# Patient Record
Sex: Female | Born: 1955 | Race: Black or African American | Hispanic: No | Smoking: Never smoker
Health system: Southern US, Community
[De-identification: ages and names within clinical notes are randomized; demographics above are authoritative.]

## PROBLEM LIST (undated history)

## (undated) DIAGNOSIS — J45909 Unspecified asthma, uncomplicated: Secondary | ICD-10-CM

## (undated) DIAGNOSIS — I509 Heart failure, unspecified: Secondary | ICD-10-CM

## (undated) DIAGNOSIS — E119 Type 2 diabetes mellitus without complications: Secondary | ICD-10-CM

---

## 2014-09-24 ENCOUNTER — Emergency Department (HOSPITAL_COMMUNITY): Payer: Medicare Other

## 2014-09-24 ENCOUNTER — Encounter (HOSPITAL_COMMUNITY): Payer: Self-pay | Admitting: Emergency Medicine

## 2014-09-24 ENCOUNTER — Inpatient Hospital Stay (HOSPITAL_COMMUNITY)
Admission: EM | Admit: 2014-09-24 | Discharge: 2014-09-30 | DRG: 208 | Disposition: A | Discharge status: Home Health | Payer: Medicare Other | Attending: Emergency Medicine | Admitting: Emergency Medicine

## 2014-09-24 DIAGNOSIS — N182 Chronic kidney disease, stage 2 (mild): Secondary | ICD-10-CM | POA: Diagnosis present

## 2014-09-24 DIAGNOSIS — D509 Iron deficiency anemia, unspecified: Secondary | ICD-10-CM | POA: Diagnosis present

## 2014-09-24 DIAGNOSIS — J441 Chronic obstructive pulmonary disease with (acute) exacerbation: Secondary | ICD-10-CM | POA: Diagnosis present

## 2014-09-24 DIAGNOSIS — R001 Bradycardia, unspecified: Secondary | ICD-10-CM | POA: Diagnosis present

## 2014-09-24 DIAGNOSIS — G9340 Encephalopathy, unspecified: Secondary | ICD-10-CM | POA: Diagnosis present

## 2014-09-24 DIAGNOSIS — J45901 Unspecified asthma with (acute) exacerbation: Secondary | ICD-10-CM | POA: Diagnosis present

## 2014-09-24 DIAGNOSIS — Z87891 Personal history of nicotine dependence: Secondary | ICD-10-CM

## 2014-09-24 DIAGNOSIS — Z794 Long term (current) use of insulin: Secondary | ICD-10-CM

## 2014-09-24 DIAGNOSIS — Z6833 Body mass index (BMI) 33.0-33.9, adult: Secondary | ICD-10-CM | POA: Diagnosis not present

## 2014-09-24 DIAGNOSIS — R6510 Systemic inflammatory response syndrome (SIRS) of non-infectious origin without acute organ dysfunction: Secondary | ICD-10-CM | POA: Diagnosis present

## 2014-09-24 DIAGNOSIS — E662 Morbid (severe) obesity with alveolar hypoventilation: Secondary | ICD-10-CM | POA: Diagnosis present

## 2014-09-24 DIAGNOSIS — Z9981 Dependence on supplemental oxygen: Secondary | ICD-10-CM

## 2014-09-24 DIAGNOSIS — I129 Hypertensive chronic kidney disease with stage 1 through stage 4 chronic kidney disease, or unspecified chronic kidney disease: Secondary | ICD-10-CM | POA: Diagnosis present

## 2014-09-24 DIAGNOSIS — Z23 Encounter for immunization: Secondary | ICD-10-CM | POA: Diagnosis not present

## 2014-09-24 DIAGNOSIS — R0602 Shortness of breath: Secondary | ICD-10-CM

## 2014-09-24 DIAGNOSIS — R000 Tachycardia, unspecified: Secondary | ICD-10-CM | POA: Diagnosis not present

## 2014-09-24 DIAGNOSIS — T41295A Adverse effect of other general anesthetics, initial encounter: Secondary | ICD-10-CM | POA: Diagnosis present

## 2014-09-24 DIAGNOSIS — I509 Heart failure, unspecified: Secondary | ICD-10-CM | POA: Diagnosis present

## 2014-09-24 DIAGNOSIS — E876 Hypokalemia: Secondary | ICD-10-CM | POA: Diagnosis present

## 2014-09-24 DIAGNOSIS — E872 Acidosis: Secondary | ICD-10-CM | POA: Diagnosis present

## 2014-09-24 DIAGNOSIS — E1165 Type 2 diabetes mellitus with hyperglycemia: Secondary | ICD-10-CM | POA: Diagnosis present

## 2014-09-24 DIAGNOSIS — J9622 Acute and chronic respiratory failure with hypercapnia: Secondary | ICD-10-CM | POA: Diagnosis present

## 2014-09-24 DIAGNOSIS — I952 Hypotension due to drugs: Secondary | ICD-10-CM | POA: Diagnosis present

## 2014-09-24 DIAGNOSIS — I248 Other forms of acute ischemic heart disease: Secondary | ICD-10-CM | POA: Diagnosis not present

## 2014-09-24 DIAGNOSIS — J9600 Acute respiratory failure, unspecified whether with hypoxia or hypercapnia: Secondary | ICD-10-CM

## 2014-09-24 DIAGNOSIS — J9690 Respiratory failure, unspecified, unspecified whether with hypoxia or hypercapnia: Secondary | ICD-10-CM | POA: Diagnosis present

## 2014-09-24 HISTORY — DX: Type 2 diabetes mellitus without complications: E11.9

## 2014-09-24 HISTORY — DX: Heart failure, unspecified: I50.9

## 2014-09-24 HISTORY — DX: Unspecified asthma, uncomplicated: J45.909

## 2014-09-24 LAB — CBC WITH DIFFERENTIAL/PLATELET
Basophils Absolute: 0.1 10*3/uL (ref 0.0–0.1)
Basophils Relative: 0 % (ref 0–1)
EOS PCT: 1 % (ref 0–5)
Eosinophils Absolute: 0.1 10*3/uL (ref 0.0–0.7)
HCT: 37.9 % (ref 36.0–46.0)
Hemoglobin: 11.8 g/dL — ABNORMAL LOW (ref 12.0–15.0)
LYMPHS PCT: 31 % (ref 12–46)
Lymphs Abs: 3.7 10*3/uL (ref 0.7–4.0)
MCH: 22.7 pg — ABNORMAL LOW (ref 26.0–34.0)
MCHC: 31.1 g/dL (ref 30.0–36.0)
MCV: 72.9 fL — ABNORMAL LOW (ref 78.0–100.0)
Monocytes Absolute: 0.7 10*3/uL (ref 0.1–1.0)
Monocytes Relative: 5 % (ref 3–12)
Neutro Abs: 7.7 10*3/uL (ref 1.7–7.7)
Neutrophils Relative %: 63 % (ref 43–77)
Platelets: 283 10*3/uL (ref 150–400)
RBC: 5.2 MIL/uL — ABNORMAL HIGH (ref 3.87–5.11)
RDW: 16.4 % — AB (ref 11.5–15.5)
WBC: 12.2 10*3/uL — ABNORMAL HIGH (ref 4.0–10.5)

## 2014-09-24 LAB — URINE MICROSCOPIC-ADD ON

## 2014-09-24 LAB — COMPREHENSIVE METABOLIC PANEL
ALT: 32 U/L (ref 0–35)
ANION GAP: 13 (ref 5–15)
AST: 89 U/L — AB (ref 0–37)
Albumin: 2.7 g/dL — ABNORMAL LOW (ref 3.5–5.2)
Alkaline Phosphatase: 116 U/L (ref 39–117)
BILIRUBIN TOTAL: 0.3 mg/dL (ref 0.3–1.2)
BUN: 11 mg/dL (ref 6–23)
CALCIUM: 8 mg/dL — AB (ref 8.4–10.5)
CO2: 24 mmol/L (ref 19–32)
Chloride: 100 mmol/L (ref 96–112)
Creatinine, Ser: 1.22 mg/dL — ABNORMAL HIGH (ref 0.50–1.10)
GFR calc Af Amer: 56 mL/min — ABNORMAL LOW (ref >90)
GFR calc non Af Amer: 48 mL/min — ABNORMAL LOW (ref >90)
Glucose, Bld: 491 mg/dL — ABNORMAL HIGH (ref 70–99)
Potassium: 3.9 mmol/L (ref 3.5–5.1)
SODIUM: 137 mmol/L (ref 135–145)
TOTAL PROTEIN: 5.9 g/dL — AB (ref 6.0–8.3)

## 2014-09-24 LAB — URINALYSIS, ROUTINE W REFLEX MICROSCOPIC
Bilirubin Urine: NEGATIVE
Glucose, UA: >1000 mg/dL — AB
Ketones, ur: NEGATIVE mg/dL
Leukocytes, UA: NEGATIVE
NITRITE: NEGATIVE
Protein, ur: >300 mg/dL — AB
Specific Gravity, Urine: 1.018 (ref 1.005–1.030)
Urobilinogen, UA: 0.2 mg/dL (ref 0.0–1.0)
pH: 7 (ref 5.0–8.0)

## 2014-09-24 LAB — I-STAT TROPONIN, ED: Troponin i, poc: 0.01 ng/mL (ref 0.00–0.08)

## 2014-09-24 LAB — I-STAT CHEM 8, ED
BUN: 13 mg/dL (ref 6–23)
CHLORIDE: 99 mmol/L (ref 96–112)
Calcium, Ion: 1.12 mmol/L (ref 1.12–1.23)
Creatinine, Ser: 1 mg/dL (ref 0.50–1.10)
Glucose, Bld: 506 mg/dL — ABNORMAL HIGH (ref 70–99)
HEMATOCRIT: 45 % (ref 36.0–46.0)
Hemoglobin: 15.3 g/dL — ABNORMAL HIGH (ref 12.0–15.0)
Potassium: 3.9 mmol/L (ref 3.5–5.1)
SODIUM: 139 mmol/L (ref 135–145)
TCO2: 27 mmol/L (ref 0–100)

## 2014-09-24 LAB — BLOOD GAS, ARTERIAL
Acid-base deficit: 3 mmol/L — ABNORMAL HIGH (ref 0.0–2.0)
Bicarbonate: 26.5 mEq/L — ABNORMAL HIGH (ref 20.0–24.0)
Drawn by: 24487
FIO2: 1 %
MECHVT: 480 mL
O2 SAT: 99 %
PATIENT TEMPERATURE: 98.6
PEEP: 5 cmH2O
RATE: 15 resp/min
TCO2: 29.6 mmol/L (ref 0–100)
pCO2 arterial: 99.7 mmHg (ref 35.0–45.0)
pH, Arterial: 7.054 — CL (ref 7.350–7.450)
pO2, Arterial: 335 mmHg — ABNORMAL HIGH (ref 80.0–100.0)

## 2014-09-24 LAB — BRAIN NATRIURETIC PEPTIDE: B Natriuretic Peptide: 209.6 pg/mL — ABNORMAL HIGH (ref 0.0–100.0)

## 2014-09-24 LAB — CBG MONITORING, ED: Glucose-Capillary: 459 mg/dL — ABNORMAL HIGH (ref 70–99)

## 2014-09-24 LAB — TROPONIN I: Troponin I: 0.03 ng/mL (ref <0.031)

## 2014-09-24 MED ORDER — PROPOFOL 10 MG/ML IV EMUL
5.0000 ug/kg/min | Freq: Once | INTRAVENOUS | Status: AC
  Administered 2014-09-24: 10 ug/kg/min via INTRAVENOUS

## 2014-09-24 MED ORDER — SODIUM BICARBONATE 8.4 % IV SOLN
50.0000 meq | Freq: Once | INTRAVENOUS | Status: AC
  Administered 2014-09-24: 50 meq via INTRAVENOUS
  Filled 2014-09-24: qty 50

## 2014-09-24 MED ORDER — ETOMIDATE 2 MG/ML IV SOLN
20.0000 mg/kg | Freq: Once | INTRAVENOUS | Status: AC
  Administered 2014-09-24: 20 mg via INTRAVENOUS

## 2014-09-24 MED ORDER — PROPOFOL 10 MG/ML IV EMUL
INTRAVENOUS | Status: AC
  Administered 2014-09-24: 10 ug/kg/min via INTRAVENOUS
  Filled 2014-09-24: qty 100

## 2014-09-24 MED ORDER — ONDANSETRON HCL 4 MG/2ML IJ SOLN
4.0000 mg | Freq: Once | INTRAMUSCULAR | Status: AC
  Administered 2014-09-24: 4 mg via INTRAVENOUS

## 2014-09-24 MED ORDER — SODIUM CHLORIDE 0.9 % IV BOLUS (SEPSIS)
1000.0000 mL | Freq: Once | INTRAVENOUS | Status: AC
  Administered 2014-09-24: 1000 mL via INTRAVENOUS

## 2014-09-24 MED ORDER — ONDANSETRON HCL 4 MG/2ML IJ SOLN
INTRAMUSCULAR | Status: AC
  Filled 2014-09-24: qty 2

## 2014-09-24 NOTE — ED Notes (Signed)
Patient biting tube and opening eyes. BP 156/90 Sedation increased.

## 2014-09-24 NOTE — ED Notes (Signed)
ICU MD at bedside

## 2014-09-24 NOTE — H&P (Addendum)
PULMONARY / CRITICAL CARE MEDICINE   Name: Molly Crawford MRN: [id number] DOB: Dec 05, 1955    ADMISSION DATE:  09/24/2014 CONSULTATION DATE:  09/24/14   REFERRING MD :  EDP  CHIEF COMPLAINT:  Respiratory failure   INITIAL PRESENTATION:  59 y.o PMH DM 2, asthma, unspecified CHF who today EMS was called for SOB occuring x 20 minutes.  Initially patient noted patient to be alert but drooling.  She was given Albuterol with no improvement in aeration.  EMS arrived patient was unresponsive to stimuli.  She was intubated in the ED for respiratory failure and airway protection and CCM consulted for admission.      STUDIES:  CXR 1/29>>No acute cardiopulmonary disease. Support apparatus well positioned CT head 1/29>>  SIGNIFICANT EVENTS: 1/29 admitted>>   HISTORY OF PRESENT ILLNESS:   59 y.o PMH DM 2, asthma, unspecified CHF who today EMS was called for SOB occuring x 20 minutes.  Initially patient noted patient to be alert but drooling.  She was given Albuterol with no improvement in aeration.  EMS arrived patient was unresponsive to stimuli.  She was intubated in the ED for respiratory failure and airway protection and CCM consulted for admission.     PAST MEDICAL HISTORY :   has a past medical history of Asthma; CHF (congestive heart failure); and Diabetes mellitus without complication.  has no past surgical history on file. Prior to Admission medications   Not on File   Not on File  FAMILY HISTORY:  has no family status information on file.  SOCIAL HISTORY:    REVIEW OF SYSTEMS:   Unable to obtain due to mental status   SUBJECTIVE:  Unable to obtain due to mental status  VITAL SIGNS: Pulse Rate:  [101-138] 104 (01/29 2255) Resp:  [21-35] 35 (01/29 2255) BP: (72-176)/(46-124) 108/77 mmHg (01/29 2255) SpO2:  [99 %-100 %] 100 % (01/29 2255) FiO2 (%):  [50 %-100 %] 50 % (01/29 2252) Weight:  [220 lb (99.791 kg)] 220 lb (99.791 kg) (01/29 2214) HEMODYNAMICS:   VENTILATOR  SETTINGS: Vent Mode:  [-] PRVC FiO2 (%):  [50 %-100 %] 50 % Set Rate:  [15 bmp-35 bmp] 35 bmp Vt Set:  [480 mL] 480 mL PEEP:  [5 cmH20] 5 cmH20 Plateau Pressure:  [30 cmH20] 30 cmH20 INTAKE / OUTPUT:  Intake/Output Summary (Last 24 hours) at 09/24/14 2305 Last data filed at 09/24/14 2247  Gross per 24 hour  Intake      0 ml  Output    150 ml  Net   -150 ml    PHYSICAL EXAMINATION: General:  Lying in bed, nad, sedated  Neuro:  Sedated, not following commands, no response to stimuli HEENT:  perrl b/l, [place]/at, 7.5 ETT Cardiovascular:  ST, no murmurs  Lungs:  ctab Abdomen:  Obese, ntnd, nl bs Musculoskeletal:  intact Skin:  Intact   LABS:  CBC  Recent Labs Lab 09/24/14 2223 09/24/14 2234  WBC 12.2*  --   HGB 11.8* 15.3*  HCT 37.9 45.0  PLT 283  --    Coag's No results for input(s): APTT, INR in the last 168 hours. BMET  Recent Labs Lab 09/24/14 2234  NA 139  K 3.9  CL 99  BUN 13  CREATININE 1.00  GLUCOSE 506*   Electrolytes No results for input(s): CALCIUM, MG, PHOS in the last 168 hours. Sepsis Markers No results for input(s): LATICACIDVEN, PROCALCITON, O2SATVEN in the last 168 hours. ABG  Recent Labs Lab 09/24/14 2230  PHART 7.054*  PCO2ART 99.7*  PO2ART 335.0*   Liver Enzymes No results for input(s): AST, ALT, ALKPHOS, BILITOT, ALBUMIN in the last 168 hours. Cardiac Enzymes No results for input(s): TROPONINI, PROBNP in the last 168 hours. Glucose  Recent Labs Lab 09/24/14 2251  GLUCAP 459*    Imaging No results found.   ASSESSMENT / PLAN: PULMONARY OETT 1/29  A: Acute hypercarbic respiratory failure -Respiratory acidosis ? Etiology. PE in DDx. No obvious pneumonia. Suspect COPD exacerbation.  H/o Asthma  P:   Intubated.  Full vent support for now +/- SBT when ready  Repeat ABG now and CXR, ABG in am  Albuterol prn, Duoneb q4  Will check d dimer to r/o PE   CARDIOVASCULAR CVL none A:  Sinus bradycardia now ST Hypotension  with propofol @15   H/o unspecified CHF  P:  Monitor VS  Repeat EKG  Bolus prn IVF. Low threshold to start pressors for MAP >65  Stat echo, pending BNP, trop x 3, Aspirin  Get further records in AM [place]  RENAL A:   Acute Respiratory acidosis  Possible AKI or CKD P:   Pending lytes to follow  Will trend and replace prn  Strict i/o, daily weights  Will give 1 amp bicarb and likely start bicarb gtt   GASTROINTESTINAL A:   NPO GI px  Transaminitis  Mild to mod Malnutrition  P:   Protonix NPO Consider TF in 1-2 days   HEMATOLOGIC A:   Microcytic anemia hbg 11.8  DVT px  P:  Lovenox, scds  Trend CBC  INFECTIOUS A:  No signs of infection 3/4 currently SIRS ? sepsis (+RR, HR, leukocysotis) P:   BCx2  1/29>> UC 1/29>> Sputum 1/29>> Flu 1/29>> RVP 1/29>> Abx: none   Trend CBC, lactic acid, PCT, will check for flu and RVP, pancultures   ENDOCRINE A:   Hyperglycemic 506  H/o DM 2  P:   Will check HA1C, serum ketones, UA to r/o DKA Insulin gtt for now then wean when blood glucose stabilize  NEUROLOGIC A:   Acute encephalopathy ? Etiology   P:   RASS goal: -2 to -3 for now   Intermittent sedation with Fentanyl prn  Will check EEG r/o seizures, CT head  Will check UDS, ethanol    FAMILY  - Updates: will update when available   - Inter-disciplinary family meet or Palliative Care meeting due by:  10/01/14      Pulmonary and Critical Care Medicine [facility] Pager: [phone]  09/24/2014, 11:05 PM Desma Maximracy McLean MD [phone]   STAFF NOTE: I have personally seen and evaluated the patient and reviewed the available data. I have discussed the patient with the housestaff officer or NP. I agree with the resident's note as documented above.  Briefly: [age] F with vague history of "asthma" (although smoker), "HF", and DM presenting with acute hypercarbic respiratory likely 2/2 COPD/Asthma exacerbation. Evaluating for PE with D-dimer. Some autopeep  when I first saw her so will decrease RR, esp given MARKED improvement in gas. Will hold bicarb drip as acidosis clearly respiratory. Suspect encephalopathy all 2/2 CO2 as well.   The patient is critically ill with multiple organ systems failure and requires high complexity decision making for assessment and support, frequent evaluation and titration of therapies, application of advanced monitoring technologies and extensive interpretation of multiple databases. Critical Care Time devoted to patient care services described in this note is 40 minutes.  Evalyn CascoAdam Chanell Nadeau, MD

## 2014-09-24 NOTE — ED Notes (Signed)
EMS called out for respiratory distress. Patient reportedly had been having difficulty breathing for 15-20 minutes. Fire had started patient on albuterol, EMS report patient was not moving any air. Patient was alert, but drooling on EMS arrival, however on arrival to this facility patient was unresponsive to stimuli with assisted ventilations. Hx of CHF, asthma, and diabetes.

## 2014-09-24 NOTE — ED Provider Notes (Signed)
CSN: [id number]     Arrival date & time 09/24/14  2200 History   First MD Initiated Contact with Patient 09/24/14 2209     Chief Complaint  Patient presents with  . Respiratory Distress     (Consider location/radiation/quality/duration/timing/severity/associated sxs/prior Treatment) Patient is a 59 y.o. female presenting with shortness of breath. The history is provided by the EMS personnel. The history is limited by the condition of the patient. No language interpreter was used.  Shortness of Breath Severity:  Severe Onset quality:  Sudden Timing:  Constant Progression:  Worsening Chronicity:  Recurrent Relieved by:  Nothing Ineffective treatments:  Inhaler, oxygen and sitting up   Past Medical History  Diagnosis Date  . Asthma   . CHF (congestive heart failure)   . Diabetes mellitus without complication    History reviewed. No pertinent past surgical history. No family history on file. History  Substance Use Topics  . Smoking status: Not on file  . Smokeless tobacco: Not on file  . Alcohol Use: Not on file   OB History    No data available     Review of Systems  Unable to perform ROS: Acuity of condition  Respiratory: Positive for shortness of breath.       Allergies  Review of patient's allergies indicates not on file.  Home Medications   Prior to Admission medications   Not on File   BP 167/86 mmHg  Pulse 108  Temp(Src) 97.8 F (36.6 C) (Oral)  Resp 25  Ht 5\' 8"  (1.727 m)  Wt 217 lb 2.5 oz (98.5 kg)  BMI 33.03 kg/m2  SpO2 99%  LMP  Physical Exam  Constitutional: She appears ill. She appears distressed.  HENT:  Head: Normocephalic and atraumatic.  Neck: Normal range of motion.  Cardiovascular: Regular rhythm, normal heart sounds and intact distal pulses.  Tachycardia present.   Pulmonary/Chest: She is in respiratory distress. She has no wheezes. She exhibits no tenderness.  Agonal Respirations  Abdominal: Soft. She exhibits no distension.   Neurological: She is unresponsive. GCS eye subscore is 1. GCS verbal subscore is 1. GCS motor subscore is 1.  Skin: Skin is warm and dry.  Nursing note and vitals reviewed.   ED Course  INTUBATION Date/Time: 09/24/2014 10:07 PM Performed by: Bethann BerkshireSCHMITT, Gregroy Dombkowski Authorized by: Bethann BerkshireSCHMITT, Shaquille Janes Consent: The procedure was performed in an emergent situation. Indications: respiratory failure Intubation method: direct Patient status: paralyzed (RSI) Preoxygenation: BVM Sedatives: etomidate Laryngoscope size: Mac 4 Tube size: 7.5 mm Tube type: cuffed Number of attempts: 1 Cricoid pressure: no Cords visualized: yes Post-procedure assessment: chest rise and CO2 detector Breath sounds: equal and absent over the epigastrium Cuff inflated: yes ETT to lip: 23 cm Tube secured with: ETT holder Chest x-ray interpreted by me. Chest x-ray findings: endotracheal tube in appropriate position Patient tolerance: Patient tolerated the procedure well with no immediate complications   (including critical care time) Labs Review Labs Reviewed  MRSA PCR SCREENING - Abnormal; Notable for the following:    MRSA by PCR POSITIVE (*)    All other components within normal limits  CBC WITH DIFFERENTIAL/PLATELET - Abnormal; Notable for the following:    WBC 12.2 (*)    RBC 5.20 (*)    Hemoglobin 11.8 (*)    MCV 72.9 (*)    MCH 22.7 (*)    RDW 16.4 (*)    All other components within normal limits  COMPREHENSIVE METABOLIC PANEL - Abnormal; Notable for the following:    Glucose, Bld  491 (*)    Creatinine, Ser 1.22 (*)    Calcium 8.0 (*)    Total Protein 5.9 (*)    Albumin 2.7 (*)    AST 89 (*)    GFR calc non Af Amer 48 (*)    GFR calc Af Amer 56 (*)    All other components within normal limits  BRAIN NATRIURETIC PEPTIDE - Abnormal; Notable for the following:    B Natriuretic Peptide 209.6 (*)    All other components within normal limits  URINALYSIS, ROUTINE W REFLEX MICROSCOPIC - Abnormal; Notable for  the following:    Glucose, UA >1000 (*)    Hgb urine dipstick MODERATE (*)    Protein, ur >300 (*)    All other components within normal limits  BLOOD GAS, ARTERIAL - Abnormal; Notable for the following:    pH, Arterial 7.054 (*)    pCO2 arterial 99.7 (*)    pO2, Arterial 335.0 (*)    Bicarbonate 26.5 (*)    Acid-base deficit 3.0 (*)    All other components within normal limits  GLUCOSE, CAPILLARY - Abnormal; Notable for the following:    Glucose-Capillary 347 (*)    All other components within normal limits  LACTIC ACID, PLASMA - Abnormal; Notable for the following:    Lactic Acid, Venous 2.2 (*)    All other components within normal limits  D-DIMER, QUANTITATIVE - Abnormal; Notable for the following:    D-Dimer, Quant 3.55 (*)    All other components within normal limits  BASIC METABOLIC PANEL - Abnormal; Notable for the following:    Potassium 3.3 (*)    CO2 37 (*)    Glucose, Bld 390 (*)    Calcium 8.0 (*)    GFR calc non Af Amer 56 (*)    GFR calc Af Amer 65 (*)    All other components within normal limits  CBC - Abnormal; Notable for the following:    WBC 12.0 (*)    Hemoglobin 10.9 (*)    HCT 34.6 (*)    MCV 73.5 (*)    MCH 23.1 (*)    RDW 16.5 (*)    All other components within normal limits  BLOOD GAS, ARTERIAL - Abnormal; Notable for the following:    pCO2 arterial 54.4 (*)    pO2, Arterial 122.0 (*)    Bicarbonate 33.2 (*)    Acid-Base Excess 8.4 (*)    All other components within normal limits  HEPATIC FUNCTION PANEL - Abnormal; Notable for the following:    Total Protein 5.2 (*)    Albumin 2.4 (*)    AST 54 (*)    All other components within normal limits  GLUCOSE, CAPILLARY - Abnormal; Notable for the following:    Glucose-Capillary 312 (*)    All other components within normal limits  GLUCOSE, CAPILLARY - Abnormal; Notable for the following:    Glucose-Capillary 241 (*)    All other components within normal limits  GLUCOSE, CAPILLARY - Abnormal;  Notable for the following:    Glucose-Capillary 200 (*)    All other components within normal limits  GLUCOSE, CAPILLARY - Abnormal; Notable for the following:    Glucose-Capillary 311 (*)    All other components within normal limits  I-STAT CHEM 8, ED - Abnormal; Notable for the following:    Glucose, Bld 506 (*)    Hemoglobin 15.3 (*)    All other components within normal limits  I-STAT ARTERIAL BLOOD GAS, ED - Abnormal;  Notable for the following:    pCO2 arterial 54.9 (*)    pO2, Arterial 167.0 (*)    Bicarbonate 32.7 (*)    Acid-Base Excess 6.0 (*)    All other components within normal limits  CBG MONITORING, ED - Abnormal; Notable for the following:    Glucose-Capillary 459 (*)    All other components within normal limits  CULTURE, BLOOD (ROUTINE X 2)  CULTURE, BLOOD (ROUTINE X 2)  URINE CULTURE  CULTURE, RESPIRATORY (NON-EXPECTORATED)  RESPIRATORY VIRUS PANEL  TROPONIN I  URINE MICROSCOPIC-ADD ON  PROCALCITONIN  PROTIME-INR  KETONES, QUALITATIVE  URINE RAPID DRUG SCREEN (HOSP PERFORMED)  ETHANOL  INFLUENZA PANEL BY PCR (TYPE A & B, H1N1)  SODIUM, URINE, RANDOM  CREATININE, URINE, RANDOM  MAGNESIUM  PHOSPHORUS  GLUCOSE, CAPILLARY  GLUCOSE, CAPILLARY  TROPONIN I  TROPONIN I  TROPONIN I  HEMOGLOBIN A1C  I-STAT TROPOININ, ED    Imaging Review Ct Head Wo Contrast  09/24/2014   CLINICAL DATA:  Found down at home, shortness of breath for 20 min. Unresponsive. Acute encephalopathy. History of diabetes and CHF.  EXAM: CT HEAD WITHOUT CONTRAST  TECHNIQUE: Contiguous axial images were obtained from the base of the skull through the vertex without intravenous contrast.  COMPARISON:  None.  FINDINGS: The ventricles and sulci are normal for age. No intraparenchymal hemorrhage, mass effect nor midline shift. Minimal supratentorial white matter hypodensities are though non-specific suggest sequelae of chronic small vessel ischemic disease. No acute large vascular territory  infarcts.  No abnormal extra-axial fluid collections. Basal cisterns are patent.  No skull fracture. The included ocular globes and orbital contents are non-suspicious. Mild paranasal sinus mucosal thickening without air-fluid levels. The mastoid air cells are well aerated. Mild temporomandibular osteoarthrosis.  IMPRESSION: No acute intracranial process.  Minimal white matter changes can be seen with chronic small vessel ischemic disease.   Electronically Signed   By: Awilda Metro   On: 09/24/2014 23:49   Dg Chest Port 1 View  09/25/2014   CLINICAL DATA:  Acute onset of respiratory failure. Subsequent encounter.  EXAM: PORTABLE CHEST - 1 VIEW  COMPARISON:  Chest radiograph performed 09/24/2014  FINDINGS: The patient's endotracheal tube is seen ending 2-3 cm above the carina. An enteric tube is noted extending below the diaphragm.  The lungs are relatively well expanded. Mild bilateral atelectasis is noted. No pleural effusion or pneumothorax is seen.  The cardiomediastinal silhouette is borderline normal in size. No acute osseous abnormalities are identified. External pacing pads are noted.  IMPRESSION: Mild bilateral atelectasis noted; lungs otherwise clear.   Electronically Signed   By: Roanna Raider M.D.   On: 09/25/2014 07:20   Dg Chest Portable 1 View  09/24/2014   CLINICAL DATA:  Trauma.  Short of breath.  EXAM: PORTABLE CHEST - 1 VIEW  COMPARISON:  None.  FINDINGS: Endotracheal tube tip projects 2.6 cm above the carina. Nasogastric tube passes below the diaphragm into the stomach.  Cardiac silhouette is normal in size and configuration. No mediastinal or hilar masses. Clear lungs. No gross pneumothorax on this supine exam.  Bony thorax appears intact.  No fracture is seen.  IMPRESSION: No acute cardiopulmonary disease.  Support apparatus well positioned.   Electronically Signed   By: Amie Portland M.D.   On: 09/24/2014 22:35     EKG Interpretation None      MDM   Final diagnoses:   Acute encephalopathy  Acute respiratory failure with hypercarbia   Patient is a 59 year old  African American female with pertinent past medical history of CHF and asthma who comes to the emergency department today with respiratory distress. Physical exam as above. Upon arrival patient is in respiratory failure with agonal respirations. She was felt to require intubation for respiratory failure and was intubated as detailed above. Initial workup included an ABG, troponin, i-STAT Chem-8, UA, CMP, BNP, chest x-ray, and EKG. Chest x-ray was unremarkable. EKG is detailed above. Patient's ABG demonstrated a pH of 7.0 and a PCO2 of 99 consistent with an asthma exacerbation.  CBC had a white count of 12.2 otherwise unremarkable. CMP with creatinine elevated to 1.22. There is no baseline creatinine in the system to compare this to. Patient was felt to require admission to the ICU for her acute respiratory failure. Patient was admitted to the MICU in a stable critical condition. Labs and imaging reviewed by myself and considered in medical decision making. Imaging was interpreted by Radiology. Care was discussed with my attending Dr. Radford Pax.      Bethann Berkshire, MD 09/25/14 1340  Nelia Shi, MD 09/26/14 [phone]

## 2014-09-25 ENCOUNTER — Inpatient Hospital Stay (HOSPITAL_COMMUNITY): Payer: Medicare Other

## 2014-09-25 DIAGNOSIS — J9600 Acute respiratory failure, unspecified whether with hypoxia or hypercapnia: Secondary | ICD-10-CM

## 2014-09-25 LAB — CBC
HEMATOCRIT: 34.6 % — AB (ref 36.0–46.0)
Hemoglobin: 10.9 g/dL — ABNORMAL LOW (ref 12.0–15.0)
MCH: 23.1 pg — ABNORMAL LOW (ref 26.0–34.0)
MCHC: 31.5 g/dL (ref 30.0–36.0)
MCV: 73.5 fL — ABNORMAL LOW (ref 78.0–100.0)
Platelets: 285 10*3/uL (ref 150–400)
RBC: 4.71 MIL/uL (ref 3.87–5.11)
RDW: 16.5 % — ABNORMAL HIGH (ref 11.5–15.5)
WBC: 12 10*3/uL — ABNORMAL HIGH (ref 4.0–10.5)

## 2014-09-25 LAB — BLOOD GAS, ARTERIAL
Acid-Base Excess: 8.4 mmol/L — ABNORMAL HIGH (ref 0.0–2.0)
BICARBONATE: 33.2 meq/L — AB (ref 20.0–24.0)
Drawn by: 31101
FIO2: 0.5 %
LHR: 12 {breaths}/min
MECHVT: 480 mL
O2 SAT: 98.7 %
PCO2 ART: 54.4 mmHg — AB (ref 35.0–45.0)
PEEP/CPAP: 5 cmH2O
PH ART: 7.403 (ref 7.350–7.450)
Patient temperature: 98.6
TCO2: 34.9 mmol/L (ref 0–100)
pO2, Arterial: 122 mmHg — ABNORMAL HIGH (ref 80.0–100.0)

## 2014-09-25 LAB — GLUCOSE, CAPILLARY
GLUCOSE-CAPILLARY: 347 mg/dL — AB (ref 70–99)
GLUCOSE-CAPILLARY: 312 mg/dL — AB (ref 70–99)
GLUCOSE-CAPILLARY: 311 mg/dL — AB (ref 70–99)
GLUCOSE-CAPILLARY: 241 mg/dL — AB (ref 70–99)
GLUCOSE-CAPILLARY: 200 mg/dL — AB (ref 70–99)
GLUCOSE-CAPILLARY: 95 mg/dL (ref 70–99)
GLUCOSE-CAPILLARY: 134 mg/dL — AB (ref 70–99)
Glucose-Capillary: 91 mg/dL (ref 70–99)
Glucose-Capillary: 161 mg/dL — ABNORMAL HIGH (ref 70–99)
Glucose-Capillary: 187 mg/dL — ABNORMAL HIGH (ref 70–99)

## 2014-09-25 LAB — MRSA PCR SCREENING: MRSA by PCR: POSITIVE — AB

## 2014-09-25 LAB — HEPATIC FUNCTION PANEL
ALBUMIN: 2.4 g/dL — AB (ref 3.5–5.2)
ALT: 29 U/L (ref 0–35)
AST: 54 U/L — ABNORMAL HIGH (ref 0–37)
Alkaline Phosphatase: 102 U/L (ref 39–117)
Bilirubin, Direct: <0.1 mg/dL (ref 0.0–0.5)
Total Bilirubin: 0.5 mg/dL (ref 0.3–1.2)
Total Protein: 5.2 g/dL — ABNORMAL LOW (ref 6.0–8.3)

## 2014-09-25 LAB — RAPID URINE DRUG SCREEN, HOSP PERFORMED
AMPHETAMINES: NOT DETECTED
Barbiturates: NOT DETECTED
Benzodiazepines: NOT DETECTED
COCAINE: NOT DETECTED
Opiates: NOT DETECTED
TETRAHYDROCANNABINOL: NOT DETECTED

## 2014-09-25 LAB — I-STAT ARTERIAL BLOOD GAS, ED
Acid-Base Excess: 6 mmol/L — ABNORMAL HIGH (ref 0.0–2.0)
Bicarbonate: 32.7 mEq/L — ABNORMAL HIGH (ref 20.0–24.0)
O2 SAT: 99 %
PO2 ART: 167 mmHg — AB (ref 80.0–100.0)
Patient temperature: 98.6
TCO2: 34 mmol/L (ref 0–100)
pCO2 arterial: 54.9 mmHg — ABNORMAL HIGH (ref 35.0–45.0)
pH, Arterial: 7.382 (ref 7.350–7.450)

## 2014-09-25 LAB — CREATININE, URINE, RANDOM: Creatinine, Urine: 21.46 mg/dL

## 2014-09-25 LAB — LACTIC ACID, PLASMA: Lactic Acid, Venous: 2.2 mmol/L (ref 0.5–2.0)

## 2014-09-25 LAB — KETONES, QUALITATIVE: ACETONE BLD: NEGATIVE

## 2014-09-25 LAB — BASIC METABOLIC PANEL
Anion gap: 5 (ref 5–15)
BUN: 9 mg/dL (ref 6–23)
CALCIUM: 8 mg/dL — AB (ref 8.4–10.5)
CO2: 37 mmol/L — ABNORMAL HIGH (ref 19–32)
Chloride: 98 mmol/L (ref 96–112)
Creatinine, Ser: 1.08 mg/dL (ref 0.50–1.10)
GFR calc non Af Amer: 56 mL/min — ABNORMAL LOW (ref >90)
GFR, EST AFRICAN AMERICAN: 65 mL/min — AB (ref >90)
Glucose, Bld: 390 mg/dL — ABNORMAL HIGH (ref 70–99)
POTASSIUM: 3.3 mmol/L — AB (ref 3.5–5.1)
Sodium: 140 mmol/L (ref 135–145)

## 2014-09-25 LAB — PROTIME-INR
INR: 0.93 (ref 0.00–1.49)
PROTHROMBIN TIME: 12.6 s (ref 11.6–15.2)

## 2014-09-25 LAB — D-DIMER, QUANTITATIVE (NOT AT ARMC): D-Dimer, Quant: 3.55 ug/mL-FEU — ABNORMAL HIGH (ref 0.00–0.48)

## 2014-09-25 LAB — TROPONIN I
Troponin I: 0.09 ng/mL — ABNORMAL HIGH (ref <0.031)
Troponin I: 0.1 ng/mL — ABNORMAL HIGH (ref <0.031)

## 2014-09-25 LAB — INFLUENZA PANEL BY PCR (TYPE A & B)
H1N1FLUPCR: NOT DETECTED
Influenza A By PCR: NEGATIVE
Influenza B By PCR: NEGATIVE

## 2014-09-25 LAB — PROCALCITONIN: PROCALCITONIN: 0.41 ng/mL

## 2014-09-25 LAB — SODIUM, URINE, RANDOM: SODIUM UR: 70 mmol/L

## 2014-09-25 LAB — MAGNESIUM: MAGNESIUM: 1.9 mg/dL (ref 1.5–2.5)

## 2014-09-25 LAB — PHOSPHORUS: PHOSPHORUS: 3.8 mg/dL (ref 2.3–4.6)

## 2014-09-25 LAB — ETHANOL: Alcohol, Ethyl (B): <5 mg/dL (ref 0–9)

## 2014-09-25 MED ORDER — ENOXAPARIN SODIUM 40 MG/0.4ML ~~LOC~~ SOLN
40.0000 mg | SUBCUTANEOUS | Status: DC
  Administered 2014-09-25 – 2014-09-30 (×6): 40 mg via SUBCUTANEOUS
  Filled 2014-09-25 (×6): qty 0.4

## 2014-09-25 MED ORDER — NALOXONE HCL 0.4 MG/ML IJ SOLN: 0.4000 mg | INTRAMUSCULAR | Status: DC | PRN

## 2014-09-25 MED ORDER — IPRATROPIUM-ALBUTEROL 0.5-2.5 (3) MG/3ML IN SOLN
3.0000 mL | RESPIRATORY_TRACT | Status: DC
  Administered 2014-09-25: 3 mL via RESPIRATORY_TRACT
  Filled 2014-09-25 (×2): qty 3

## 2014-09-25 MED ORDER — INFLUENZA VAC SPLIT QUAD 0.5 ML IM SUSY
0.5000 mL | PREFILLED_SYRINGE | INTRAMUSCULAR | Status: DC
  Filled 2014-09-25: qty 0.5

## 2014-09-25 MED ORDER — CHLORHEXIDINE GLUCONATE CLOTH 2 % EX PADS
6.0000 | MEDICATED_PAD | Freq: Every day | CUTANEOUS | Status: AC
  Administered 2014-09-25 – 2014-09-29 (×5): 6 via TOPICAL

## 2014-09-25 MED ORDER — ACETAMINOPHEN 325 MG PO TABS
650.0000 mg | ORAL_TABLET | Freq: Four times a day (QID) | ORAL | Status: DC | PRN
  Administered 2014-09-25 – 2014-09-27 (×6): 650 mg via ORAL
  Filled 2014-09-25 (×7): qty 2

## 2014-09-25 MED ORDER — IPRATROPIUM-ALBUTEROL 0.5-2.5 (3) MG/3ML IN SOLN
3.0000 mL | Freq: Four times a day (QID) | RESPIRATORY_TRACT | Status: DC
  Administered 2014-09-25 – 2014-09-26 (×7): 3 mL via RESPIRATORY_TRACT
  Filled 2014-09-25 (×6): qty 3

## 2014-09-25 MED ORDER — INSULIN GLARGINE 100 UNIT/ML ~~LOC~~ SOLN
20.0000 [IU] | Freq: Every day | SUBCUTANEOUS | Status: DC
  Administered 2014-09-25 – 2014-09-26 (×2): 20 [IU] via SUBCUTANEOUS
  Filled 2014-09-25 (×3): qty 0.2

## 2014-09-25 MED ORDER — SODIUM CHLORIDE 0.9 % IV SOLN
INTRAVENOUS | Status: AC
  Administered 2014-09-25: 2.6 [IU]/h via INTRAVENOUS
  Filled 2014-09-25: qty 2.5

## 2014-09-25 MED ORDER — POTASSIUM CHLORIDE 10 MEQ/100ML IV SOLN
10.0000 meq | INTRAVENOUS | Status: AC
  Administered 2014-09-25 (×4): 10 meq via INTRAVENOUS
  Filled 2014-09-25 (×3): qty 100

## 2014-09-25 MED ORDER — BUDESONIDE 0.25 MG/2ML IN SUSP
0.2500 mg | Freq: Four times a day (QID) | RESPIRATORY_TRACT | Status: DC
  Administered 2014-09-25 – 2014-09-27 (×7): 0.25 mg via RESPIRATORY_TRACT
  Filled 2014-09-25 (×13): qty 2

## 2014-09-25 MED ORDER — ASPIRIN 300 MG RE SUPP: 300.0000 mg | RECTAL | Status: AC

## 2014-09-25 MED ORDER — INSULIN ASPART 100 UNIT/ML ~~LOC~~ SOLN
0.0000 [IU] | SUBCUTANEOUS | Status: DC
  Administered 2014-09-25: 2 [IU] via SUBCUTANEOUS
  Administered 2014-09-25 (×2): 3 [IU] via SUBCUTANEOUS
  Administered 2014-09-26: 2 [IU] via SUBCUTANEOUS

## 2014-09-25 MED ORDER — ASPIRIN 81 MG PO CHEW
324.0000 mg | CHEWABLE_TABLET | ORAL | Status: AC
  Administered 2014-09-25: 324 mg via ORAL
  Filled 2014-09-25: qty 4

## 2014-09-25 MED ORDER — FENTANYL CITRATE 0.05 MG/ML IJ SOLN: 100.0000 ug | INTRAMUSCULAR | Status: DC | PRN

## 2014-09-25 MED ORDER — SODIUM CHLORIDE 0.9 % IV SOLN
25.0000 ug/h | INTRAVENOUS | Status: DC
  Administered 2014-09-25: 200 ug/h via INTRAVENOUS
  Filled 2014-09-25: qty 50

## 2014-09-25 MED ORDER — HYDRALAZINE HCL 20 MG/ML IJ SOLN: 5.0000 mg | INTRAMUSCULAR | Status: DC | PRN

## 2014-09-25 MED ORDER — POTASSIUM CHLORIDE IN NACL 20-0.45 MEQ/L-% IV SOLN
INTRAVENOUS | Status: DC
  Administered 2014-09-25 – 2014-09-26 (×2): via INTRAVENOUS
  Filled 2014-09-25 (×3): qty 1000

## 2014-09-25 MED ORDER — INSULIN GLARGINE 100 UNIT/ML ~~LOC~~ SOLN
20.0000 [IU] | Freq: Every day | SUBCUTANEOUS | Status: DC
  Filled 2014-09-25: qty 0.2

## 2014-09-25 MED ORDER — PANTOPRAZOLE SODIUM 40 MG IV SOLR
40.0000 mg | Freq: Every day | INTRAVENOUS | Status: DC
  Administered 2014-09-25: 40 mg via INTRAVENOUS
  Filled 2014-09-25: qty 40

## 2014-09-25 MED ORDER — HYDRALAZINE HCL 20 MG/ML IJ SOLN
5.0000 mg | INTRAMUSCULAR | Status: DC | PRN
  Administered 2014-09-26 (×2): 5 mg via INTRAVENOUS
  Filled 2014-09-25 (×2): qty 1

## 2014-09-25 MED ORDER — ALBUTEROL SULFATE (2.5 MG/3ML) 0.083% IN NEBU
2.5000 mg | INHALATION_SOLUTION | RESPIRATORY_TRACT | Status: DC | PRN
  Administered 2014-09-26 – 2014-09-30 (×6): 2.5 mg via RESPIRATORY_TRACT
  Filled 2014-09-25 (×5): qty 3

## 2014-09-25 MED ORDER — MUPIROCIN 2 % EX OINT
1.0000 "application " | TOPICAL_OINTMENT | Freq: Two times a day (BID) | CUTANEOUS | Status: AC
  Administered 2014-09-25 – 2014-09-29 (×10): 1 via NASAL
  Filled 2014-09-25: qty 22

## 2014-09-25 MED ORDER — SODIUM CHLORIDE 0.9 % IV SOLN: 250.0000 mL | INTRAVENOUS | Status: DC | PRN

## 2014-09-25 NOTE — Procedures (Signed)
Extubation Procedure Note  Patient Details:   Name: Molly DanielLinda Crawford DOB: [birth year] MRN: [id number]   Airway Documentation:    - cuff leak prior to extubation, per Dr. Ivar DrapeSimmonds-proceed w/ extubation. RN aware.  Evaluation  O2 sats: stable throughout Complications: No apparent complications Patient did tolerate procedure well. Bilateral Breath Sounds: Clear, Diminished Suctioning: Airway Yes, pt able to speak, no stridor noted.  No distress noted.    [name], [name] 09/25/2014, 9:47 AM

## 2014-09-25 NOTE — Progress Notes (Signed)
Dr. Bard HerbertSimmonds placed pt on SBT at 0750.  Pt tol well so far.

## 2014-09-25 NOTE — Progress Notes (Signed)
RASS -1 Passed SBT and extubated. Tolerating well  Post extubation, cognition appears intact  Filed Vitals:   09/25/14 0600 09/25/14 0700 09/25/14 0750 09/25/14 0935  BP: 125/68 125/66 143/64   Pulse: 90 86 97 116  Temp: 98.6 F (37 C) 98.5 F (36.9 C)    TempSrc:      Resp: 14 12 16 21   Height:      Weight:      SpO2: 100% 99% 96% 96%   NAD No JVD No wheezes RRR s M Obese, soft, NT Ext warm, no edema Neuro without focal deficits  I have reviewed all of today's lab results. Relevant abnormalities are discussed in the A/P section  CXR: reviewed  IMPRESSION: Acute hypercarbic respiratory failure, resolved Suspect EACOPD, resolved Hypotension, resolved Sinus bradycardia, resolved H/O CHF - appears compensated Mild hypokalemia DM 2 - control improved on insulin gtt Acute encephalopathy, resolved  PLAN: Extubated Monitor in ICU post extubation Supp O2 as needed to maintain SpO2 > 90 % Cont nebulized BDs and steroids Transition from insulin gtt to SSI Minimize sedating meds - esp opioids  Molly Fischeravid Kenyana Husak, MD ; [place] [facility] service Mobile [phone].  After 5:30 PM or weekends, call [phone]

## 2014-09-26 ENCOUNTER — Inpatient Hospital Stay (HOSPITAL_COMMUNITY): Payer: Medicare Other

## 2014-09-26 LAB — GLUCOSE, CAPILLARY
GLUCOSE-CAPILLARY: 139 mg/dL — AB (ref 70–99)
GLUCOSE-CAPILLARY: 234 mg/dL — AB (ref 70–99)
Glucose-Capillary: 139 mg/dL — ABNORMAL HIGH (ref 70–99)
Glucose-Capillary: 152 mg/dL — ABNORMAL HIGH (ref 70–99)
Glucose-Capillary: 318 mg/dL — ABNORMAL HIGH (ref 70–99)
Glucose-Capillary: 87 mg/dL (ref 70–99)

## 2014-09-26 LAB — CBC WITH DIFFERENTIAL/PLATELET
BASOS PCT: 0 % (ref 0–1)
Basophils Absolute: 0 10*3/uL (ref 0.0–0.1)
EOS PCT: 1 % (ref 0–5)
Eosinophils Absolute: 0.1 10*3/uL (ref 0.0–0.7)
HCT: 34.5 % — ABNORMAL LOW (ref 36.0–46.0)
HEMOGLOBIN: 10.5 g/dL — AB (ref 12.0–15.0)
LYMPHS ABS: 1.1 10*3/uL (ref 0.7–4.0)
LYMPHS PCT: 9 % — AB (ref 12–46)
MCH: 22.1 pg — ABNORMAL LOW (ref 26.0–34.0)
MCHC: 30.4 g/dL (ref 30.0–36.0)
MCV: 72.6 fL — ABNORMAL LOW (ref 78.0–100.0)
Monocytes Absolute: 0.8 10*3/uL (ref 0.1–1.0)
Monocytes Relative: 6 % (ref 3–12)
NEUTROS PCT: 84 % — AB (ref 43–77)
Neutro Abs: 10.4 10*3/uL — ABNORMAL HIGH (ref 1.7–7.7)
PLATELETS: 280 10*3/uL (ref 150–400)
RBC: 4.75 MIL/uL (ref 3.87–5.11)
RDW: 16.3 % — ABNORMAL HIGH (ref 11.5–15.5)
WBC: 12.3 10*3/uL — ABNORMAL HIGH (ref 4.0–10.5)

## 2014-09-26 LAB — COMPREHENSIVE METABOLIC PANEL
ALBUMIN: 2.2 g/dL — AB (ref 3.5–5.2)
ALT: 25 U/L (ref 0–35)
ANION GAP: 8 (ref 5–15)
AST: 31 U/L (ref 0–37)
Alkaline Phosphatase: 85 U/L (ref 39–117)
BUN: 9 mg/dL (ref 6–23)
CALCIUM: 8 mg/dL — AB (ref 8.4–10.5)
CO2: 27 mmol/L (ref 19–32)
Chloride: 106 mmol/L (ref 96–112)
Creatinine, Ser: 0.95 mg/dL (ref 0.50–1.10)
GFR calc Af Amer: 76 mL/min — ABNORMAL LOW (ref >90)
GFR calc non Af Amer: 65 mL/min — ABNORMAL LOW (ref >90)
Glucose, Bld: 149 mg/dL — ABNORMAL HIGH (ref 70–99)
POTASSIUM: 3.3 mmol/L — AB (ref 3.5–5.1)
Sodium: 141 mmol/L (ref 135–145)
Total Bilirubin: 0.6 mg/dL (ref 0.3–1.2)
Total Protein: 5.4 g/dL — ABNORMAL LOW (ref 6.0–8.3)

## 2014-09-26 LAB — URINE CULTURE: Colony Count: 4000

## 2014-09-26 LAB — TROPONIN I: Troponin I: 0.07 ng/mL — ABNORMAL HIGH (ref <0.031)

## 2014-09-26 LAB — LACTIC ACID, PLASMA: LACTIC ACID, VENOUS: 0.8 mmol/L (ref 0.5–2.0)

## 2014-09-26 LAB — MAGNESIUM: MAGNESIUM: 1.6 mg/dL (ref 1.5–2.5)

## 2014-09-26 LAB — PHOSPHORUS: Phosphorus: 2.7 mg/dL (ref 2.3–4.6)

## 2014-09-26 MED ORDER — PREDNISONE 20 MG PO TABS
40.0000 mg | ORAL_TABLET | Freq: Every day | ORAL | Status: AC
  Administered 2014-09-26 – 2014-09-30 (×5): 40 mg via ORAL
  Filled 2014-09-26 (×6): qty 2

## 2014-09-26 MED ORDER — LOSARTAN POTASSIUM 50 MG PO TABS
50.0000 mg | ORAL_TABLET | Freq: Every day | ORAL | Status: DC
  Administered 2014-09-26 – 2014-09-28 (×3): 50 mg via ORAL
  Filled 2014-09-26 (×4): qty 1

## 2014-09-26 MED ORDER — INSULIN ASPART 100 UNIT/ML ~~LOC~~ SOLN
0.0000 [IU] | Freq: Every day | SUBCUTANEOUS | Status: DC
  Administered 2014-09-26 – 2014-09-29 (×3): 4 [IU] via SUBCUTANEOUS

## 2014-09-26 MED ORDER — INSULIN ASPART 100 UNIT/ML ~~LOC~~ SOLN
0.0000 [IU] | Freq: Three times a day (TID) | SUBCUTANEOUS | Status: DC
  Administered 2014-09-26: 3 [IU] via SUBCUTANEOUS
  Administered 2014-09-27: 11 [IU] via SUBCUTANEOUS
  Administered 2014-09-27: 3 [IU] via SUBCUTANEOUS
  Administered 2014-09-27: 11 [IU] via SUBCUTANEOUS
  Administered 2014-09-28: 7 [IU] via SUBCUTANEOUS
  Administered 2014-09-28: 3 [IU] via SUBCUTANEOUS
  Administered 2014-09-28 – 2014-09-30 (×3): 4 [IU] via SUBCUTANEOUS

## 2014-09-26 MED ORDER — POTASSIUM CHLORIDE CRYS ER 20 MEQ PO TBCR
40.0000 meq | EXTENDED_RELEASE_TABLET | ORAL | Status: AC
  Administered 2014-09-26 (×2): 40 meq via ORAL
  Filled 2014-09-26 (×2): qty 2

## 2014-09-26 MED ORDER — ONDANSETRON HCL 4 MG/2ML IJ SOLN
4.0000 mg | Freq: Four times a day (QID) | INTRAMUSCULAR | Status: DC | PRN
  Administered 2014-09-26 – 2014-09-27 (×2): 4 mg via INTRAVENOUS
  Filled 2014-09-26 (×2): qty 2

## 2014-09-26 MED ORDER — LEVOFLOXACIN IN D5W 750 MG/150ML IV SOLN
750.0000 mg | INTRAVENOUS | Status: DC
  Administered 2014-09-26 – 2014-09-28 (×3): 750 mg via INTRAVENOUS
  Filled 2014-09-26 (×3): qty 150

## 2014-09-26 MED ORDER — METOPROLOL SUCCINATE ER 50 MG PO TB24
50.0000 mg | ORAL_TABLET | Freq: Every day | ORAL | Status: DC
  Administered 2014-09-26 – 2014-09-30 (×5): 50 mg via ORAL
  Filled 2014-09-26 (×5): qty 1

## 2014-09-26 MED ORDER — POTASSIUM CHLORIDE CRYS ER 20 MEQ PO TBCR: 40.0000 meq | EXTENDED_RELEASE_TABLET | Freq: Once | ORAL | Status: DC

## 2014-09-26 MED ORDER — INSULIN ASPART 100 UNIT/ML ~~LOC~~ SOLN
0.0000 [IU] | SUBCUTANEOUS | Status: DC
  Administered 2014-09-26: 3 [IU] via SUBCUTANEOUS

## 2014-09-26 MED ORDER — INSULIN ASPART 100 UNIT/ML ~~LOC~~ SOLN
4.0000 [IU] | Freq: Three times a day (TID) | SUBCUTANEOUS | Status: DC
  Administered 2014-09-26 – 2014-09-29 (×2): 4 [IU] via SUBCUTANEOUS

## 2014-09-26 MED ORDER — FUROSEMIDE 10 MG/ML IJ SOLN
40.0000 mg | Freq: Once | INTRAMUSCULAR | Status: AC
  Administered 2014-09-26: 40 mg via INTRAVENOUS
  Filled 2014-09-26: qty 4

## 2014-09-26 MED ORDER — IPRATROPIUM-ALBUTEROL 0.5-2.5 (3) MG/3ML IN SOLN
3.0000 mL | Freq: Three times a day (TID) | RESPIRATORY_TRACT | Status: DC
  Administered 2014-09-27 (×2): 3 mL via RESPIRATORY_TRACT
  Filled 2014-09-26 (×2): qty 3

## 2014-09-26 MED ORDER — MAGNESIUM SULFATE 2 GM/50ML IV SOLN
2.0000 g | Freq: Once | INTRAVENOUS | Status: AC
  Administered 2014-09-26: 2 g via INTRAVENOUS
  Filled 2014-09-26: qty 50

## 2014-09-26 NOTE — Progress Notes (Signed)
Physical Therapy Evaluation Patient Details Name: Molly Crawford MRN: [id number] DOB: [birth year] Today's Date: 09/26/2014   History of Present Illness  Patient is a 59 yo female admitted 09/24/14 with COPD exacerbation, respiratory failure, intubated 09/24/14, extubated 09/25/14.  PMH:  DM, asthma, CHF, COPD on home O2  Clinical Impression  Patient presents with problems listed below.  Will benefit from acute PT to maximize independence prior to discharge home with family.    Follow Up Recommendations Home health PT;Supervision/Assistance - 24 hour    Equipment Recommendations  3in1 (PT) (Patient requesting shower seat - defer to OT)    Recommendations for Other Services OT consult     Precautions / Restrictions Precautions Precautions: Fall Restrictions Weight Bearing Restrictions: No      Mobility  Bed Mobility Overal bed mobility: Needs Assistance Bed Mobility: Supine to Sit;Sit to Supine     Supine to sit: Min assist;HOB elevated Sit to supine: Min assist;HOB elevated   General bed mobility comments: Verbal cues for technique.  Assist to raise trunk to sitting position.  Patient sat EOB x 8 minutes.  Declined standing attempts.  Fatigued quickly.  Assist to bring LE's on to bed to return to supine.  Transfers                 General transfer comment: Patient declined - too nauseated.  Ambulation/Gait                Stairs            Wheelchair Mobility    Modified Rankin (Stroke Patients Only)       Balance Overall balance assessment: Needs assistance Sitting-balance support: Single extremity supported;Feet supported Sitting balance-Leahy Scale: Fair                                       Pertinent Vitals/Pain Pain Assessment: Faces Faces Pain Scale: Hurts little more Pain Location: Headache and lower abdomen Pain Descriptors / Indicators: Headache;Aching Pain Intervention(s): Monitored during session;Patient requesting  pain meds-RN notified;Repositioned    Home Living Family/patient expects to be discharged to:: Private residence Living Arrangements: Other relatives (Niece, niece's husband and their children) Available Help at Discharge: Family;Available 24 hours/day Type of Home: House Home Access: Stairs to enter Entrance Stairs-Rails: None Entrance Stairs-Number of Steps: 4 Home Layout: One level Home Equipment: Wheelchair - Fluor Corporationmanual;Walker - 4 wheels      Prior Function Level of Independence: Independent;Needs assistance   Gait / Transfers Assistance Needed: Independent with ambulation household distances.  Minimal out of house activity  ADL's / Homemaking Assistance Needed: Assist with bathing/dressing and with meal prep.  Patient unable to do housework.        Hand Dominance        Extremity/Trunk Assessment   Upper Extremity Assessment: Generalized weakness           Lower Extremity Assessment: Generalized weakness         Communication   Communication: No difficulties  Cognition Arousal/Alertness: Awake/alert Behavior During Therapy: Flat affect Overall Cognitive Status: Within Functional Limits for tasks assessed                      General Comments      Exercises        Assessment/Plan    PT Assessment Patient needs continued PT services  PT Diagnosis Difficulty walking;Generalized weakness;Acute pain  PT Problem List Decreased strength;Decreased activity tolerance;Decreased balance;Decreased mobility;Decreased knowledge of use of DME;Decreased knowledge of precautions;Cardiopulmonary status limiting activity;Pain;Obesity  PT Treatment Interventions DME instruction;Gait training;Functional mobility training;Therapeutic activities;Patient/family education   PT Goals (Current goals can be found in the Care Plan section) Acute Rehab PT Goals Patient Stated Goal: To stop nausea.  To get stronger PT Goal Formulation: With patient/family Time For Goal  Achievement: 10/03/14 Potential to Achieve Goals: Good    Frequency Min 3X/week   Barriers to discharge        Co-evaluation               End of Session Equipment Utilized During Treatment: Oxygen Activity Tolerance: Patient limited by pain;Patient limited by fatigue (Limited by nausea) Patient left: in bed;with call bell/phone within reach;with bed alarm set;with family/visitor present Nurse Communication: Mobility status;Patient requests pain meds         Time: 1610-9604 PT Time Calculation (min) (ACUTE ONLY): 16 min   Charges:   PT Evaluation $Initial PT Evaluation Tier I: 1 Procedure     PT G CodesVena Austria 10-13-2014, 2:47 PM Durenda Hurt. Renaldo Fiddler, [facility] Acute Rehab Services Pager [phone]

## 2014-09-26 NOTE — Progress Notes (Signed)
Placed pt. On cpap as per order. Pt. Tolerating well at this time. 

## 2014-09-26 NOTE — Progress Notes (Signed)
eLink Physician-Brief Progress Note Patient Name: Molly DanielLinda Crawford DOB: May 21, 1956 MRN: [id number]   Date of Service  09/26/2014  HPI/Events of Note  Patient extubated today and has been stable.  Request for CPAP which patient wears qHS at home  eICU Interventions  CPAP ordered     Intervention Category Intermediate Interventions: Respiratory distress - evaluation and management  DETERDING,ELIZABETH 09/26/2014, 1:39 AM

## 2014-09-26 NOTE — Progress Notes (Signed)
58yo female to start IV ABX for SIRS vs COPD exacerbation.  Will start Levaquin 750mg  IV Q24H for CrCl ~70 ml/min and monitor CBC and Cx.  Vernard GamblesVeronda Breahna Boylen, PharmD, BCPS 09/26/2014 7:43 AM

## 2014-09-26 NOTE — Progress Notes (Signed)
Utilization review completed.  

## 2014-09-26 NOTE — Progress Notes (Signed)
PULMONARY / CRITICAL CARE MEDICINE   Name: Molly Crawford MRN: [id number] DOB: 1956/07/28    ADMISSION DATE:  09/24/2014 CONSULTATION DATE:  09/24/14   REFERRING MD :  EDP  CHIEF COMPLAINT:  Respiratory failure   INITIAL PRESENTATION:  59 y.o PMH DM 2, asthma, unspecified CHF who today EMS was called for SOB occuring x 20 minutes. Initially patient noted patient to be alert but drooling. She was given Albuterol with no improvement in aeration. EMS arrived patient was unresponsive to stimuli. She was intubated in the ED for respiratory failure and airway protection and CCM consulted for admission.   STUDIES:  CXR 1/29>>No acute cardiopulmonary disease. Support apparatus well positioned CT head 1/29>>No acute intracranial process.  Minimal white matter changes can be seen with chronic small vessel ischemic disease. CXR 1/30>> Mild bilateral atelectasis noted; lungs otherwise clear. CXR 1/31>>Stable mild bibasilar subsegmental atelectasis. No other acute abnormality seen.   SIGNIFICANT EVENTS: 1/29 admitted>> 1/30>>extubated   HISTORY OF PRESENT ILLNESS:   59 y.o PMH DM 2, asthma, unspecified CHF who today EMS was called for SOB occuring x 20 minutes. Initially patient noted patient to be alert but drooling. She was given Albuterol with no improvement in aeration. EMS arrived patient was unresponsive to stimuli. She was intubated in the ED for respiratory failure and airway protection and CCM consulted for admission.  PAST MEDICAL HISTORY :   has a past medical history of Asthma; CHF (congestive heart failure); and Diabetes mellitus without complication.  has no past surgical history on file. Prior to Admission medications   Medication Sig Start Date End Date Taking? Authorizing Provider  azithromycin (ZITHROMAX) 250 MG tablet Take 250 mg by mouth See admin instructions. Take 2 daily for 2 days then 1 tablet for 6 days   Yes Historical Provider, MD  budesonide-formoterol  (SYMBICORT) 160-4.5 MCG/ACT inhaler Inhale 2 puffs into the lungs 2 (two) times daily.   Yes Historical Provider, MD  fluconazole (DIFLUCAN) 200 MG tablet Take 200 mg by mouth daily.   Yes Historical Provider, MD  insulin aspart (NOVOLOG) 100 UNIT/ML FlexPen Inject 15 Units into the skin 3 (three) times daily with meals.   Yes Historical Provider, MD  insulin glargine (LANTUS) 100 unit/mL SOPN Inject 60 Units into the skin at bedtime.   Yes Historical Provider, MD  losartan (COZAAR) 50 MG tablet Take 50 mg by mouth at bedtime.   Yes Historical Provider, MD  metoprolol succinate (TOPROL-XL) 50 MG 24 hr tablet Take 50 mg by mouth daily. Take with or immediately following a meal.   Yes Historical Provider, MD  NIFEdipine (PROCARDIA-XL/ADALAT-CC/NIFEDICAL-XL) 30 MG 24 hr tablet Take 30 mg by mouth daily.   Yes Historical Provider, MD  verapamil (VERELAN PM) 120 MG 24 hr capsule Take 120 mg by mouth daily.   Yes Historical Provider, MD   Not on File  FAMILY HISTORY:  has no family status information on file.  SOCIAL HISTORY:    REVIEW OF SYSTEMS:   HEENT: +h/a today 12/10 Cardiac: denies chest pain Pulm: +sob Abd: denies ab pain Ext: denies leg edema  Neuro: +h/a    SUBJECTIVE:   VITAL SIGNS: Temp:  [89 F (31.7 C)-100.5 F (38.1 C)] 98.4 F (36.9 C) (01/31 0456) Pulse Rate:  [86-123] 115 (01/31 0200) Resp:  [10-37] 28 (01/31 0200) BP: (125-172)/(60-102) 159/90 mmHg (01/31 0200) SpO2:  [88 %-99 %] 96 % (01/31 0200) FiO2 (%):  [30 %] 30 % (01/30 0750) HEMODYNAMICS:   VENTILATOR SETTINGS: Vent  Mode:  [-] CPAP;PSV FiO2 (%):  [30 %] 30 % PEEP:  [5 cmH20] 5 cmH20 Pressure Support:  [5 cmH20] 5 cmH20 INTAKE / OUTPUT:  Intake/Output Summary (Last 24 hours) at 09/26/14 0647 Last data filed at 09/26/14 0100  Gross per 24 hour  Intake 1241.47 ml  Output    650 ml  Net 591.47 ml    PHYSICAL EXAMINATION: General:  Lying in bed, nad Neuro:  Asleep but arousable, follows  commands, moves all 4 ext  HEENT:  [place]/at  Cardiovascular:  ST, no murmurs  Lungs:  Decreased aeration b/l lung fields  Abdomen:  Obese, soft, ntnd, wnl bs  Musculoskeletal:  Intact  Skin:  intact  LABS:  CBC  Recent Labs Lab 09/24/14 2223 09/24/14 2234 09/25/14 0220  WBC 12.2*  --  12.0*  HGB 11.8* 15.3* 10.9*  HCT 37.9 45.0 34.6*  PLT 283  --  285   Coag's  Recent Labs Lab 09/25/14 0220  INR 0.93   BMET  Recent Labs Lab 09/24/14 2223 09/24/14 2234 09/25/14 0220  NA 137 139 140  K 3.9 3.9 3.3*  CL 100 99 98  CO2 24  --  37*  BUN CREATININE 1.22* 1.00 1.08  GLUCOSE 491* 506* 390*   Electrolytes  Recent Labs Lab 09/24/14 2223 09/25/14 0220  CALCIUM 8.0* 8.0*  MG  --  1.9  PHOS  --  3.8   Sepsis Markers  Recent Labs Lab 09/25/14 0220  LATICACIDVEN 2.2*  PROCALCITON 0.41   ABG  Recent Labs Lab 09/24/14 2230 09/25/14 0005 09/25/14 0413  PHART 7.054* 7.382 7.403  PCO2ART 99.7* 54.9* 54.4*  PO2ART 335.0* 167.0* 122.0*   Liver Enzymes  Recent Labs Lab 09/24/14 2223 09/25/14 0220  AST 89* 54*  ALT 32 29  ALKPHOS 116 102  BILITOT 0.3 0.5  ALBUMIN 2.7* 2.4*   Cardiac Enzymes  Recent Labs Lab 09/25/14 1330 09/25/14 1801 09/25/14 2336  TROPONINI 0.09* 0.10* 0.07*   Glucose  Recent Labs Lab 09/25/14 0955 09/25/14 1226 09/25/14 1512 09/25/14 2032 09/26/14 0044 09/26/14 0454  GLUCAP 95 161* 134* 187* 139* 87    Imaging Dg Chest Port 1 View  09/25/2014   CLINICAL DATA:  Acute onset of respiratory failure. Subsequent encounter.  EXAM: PORTABLE CHEST - 1 VIEW  COMPARISON:  Chest radiograph performed 09/24/2014  FINDINGS: The patient's endotracheal tube is seen ending 2-3 cm above the carina. An enteric tube is noted extending below the diaphragm.  The lungs are relatively well expanded. Mild bilateral atelectasis is noted. No pleural effusion or pneumothorax is seen.  The cardiomediastinal silhouette is borderline  normal in size. No acute osseous abnormalities are identified. External pacing pads are noted.  IMPRESSION: Mild bilateral atelectasis noted; lungs otherwise clear.   Electronically Signed   By: Roanna Raider M.D.   On: 09/25/2014 07:20     ASSESSMENT / PLAN:  PULMONARY OETT 1/29>1/30 A: Acute hypercarbic respiratory failure -Respiratory acidosis ? Etiology likely Suspect COPD exacerbation vs asthma exacerbation (O2 dept at home 4L per pt) H/o Asthma  H/o OSA H/o smoking   P:   Consider addition of steroids and Abx (Levaquin 1/31, Prednisone 40 mg qd x 5 days) CXR this am (lasix 40 x 1 dose today) Prn Albuterol, Pulmicort neb q6, Duoneb q6  Will need pfts outpatient  cpap qhs    CARDIOVASCULAR CVL none  A:  Sinus tachycardia  HTN Elevated troponin, down trending  Unspecified CHF Sinus bradycardia  resolved   P:  Will add Cozaar 50 mg, Toprol XL 50 mg.  Holding Procardia 30 mg XL, Verapamil 120 XL for now Prn Hydralazine 5q4 prn sbp >170  RENAL A:   Hypokalemia  Respiratory acidosis resolved  CKD  P:   Check lytes replete prn  1/2 NS 75 cc/hr   GASTROINTESTINAL A:   Nutrition transaminitis  P:   NPO for now added carb, mod diet  CMET today   HEMATOLOGIC A:   DVT px  Microcytic anemia  P:  Lovenox sq Trend CBC-will need outpatient f/u with colonoscopy if not had already   INFECTIOUS A:   Leukocytosis  SIRS ?source  P:   BCx2 1/30  UC 1/30  Sputum 1/30  MRSA 1/30 + RVP 1/30  Abx: Levoquin 1/31   Bactroban  F/u cultures Added Levaquin  ENDOCRINE A:   Hyperglycemia improved with h/o DM  P:   SSI-R, Lantus 20 units qhs (home dose 60 units and Novolog 15 u tid), Novolog 4 tid with meals  NEUROLOGIC A:   Acute encephalopathy resolved  H/a  P:   RASS goal: 0  Prn Tylenol    FAMILY  - Updates: will update as become available   - Inter-disciplinary family meet or Palliative Care meeting due by:  10/01/14        Pulmonary and  Critical Care Medicine [facility] Pager: [phone]  09/26/2014, 6:47 AM Desma Maximracy McLean MD   PCCM ATTENDING: I have reviewed pt's initial presentation, consultants notes and hospital database in detail.  The above assessment and plan was formulated under my direction.    Billy Fischeravid Simonds, MD;  PCCM service; Mobile [phone]

## 2014-09-27 LAB — BASIC METABOLIC PANEL
Anion gap: 6 (ref 5–15)
BUN: 15 mg/dL (ref 6–23)
CO2: 31 mmol/L (ref 19–32)
Calcium: 8.5 mg/dL (ref 8.4–10.5)
Chloride: 100 mmol/L (ref 96–112)
Creatinine, Ser: 1.2 mg/dL — ABNORMAL HIGH (ref 0.50–1.10)
GFR calc Af Amer: 57 mL/min — ABNORMAL LOW (ref >90)
GFR, EST NON AFRICAN AMERICAN: 49 mL/min — AB (ref >90)
Glucose, Bld: 318 mg/dL — ABNORMAL HIGH (ref 70–99)
POTASSIUM: 4 mmol/L (ref 3.5–5.1)
SODIUM: 137 mmol/L (ref 135–145)

## 2014-09-27 LAB — URINALYSIS, ROUTINE W REFLEX MICROSCOPIC
Bilirubin Urine: NEGATIVE
Glucose, UA: >1000 mg/dL — AB
Ketones, ur: 15 mg/dL — AB
LEUKOCYTES UA: NEGATIVE
Nitrite: NEGATIVE
PH: 5.5 (ref 5.0–8.0)
Protein, ur: >300 mg/dL — AB
Specific Gravity, Urine: 1.039 — ABNORMAL HIGH (ref 1.005–1.030)
UROBILINOGEN UA: 0.2 mg/dL (ref 0.0–1.0)

## 2014-09-27 LAB — HEMOGLOBIN A1C
Hgb A1c MFr Bld: 11.2 % — ABNORMAL HIGH (ref 4.8–5.6)
Mean Plasma Glucose: 275 mg/dL

## 2014-09-27 LAB — CBC
HCT: 35.4 % — ABNORMAL LOW (ref 36.0–46.0)
Hemoglobin: 10.7 g/dL — ABNORMAL LOW (ref 12.0–15.0)
MCH: 22.1 pg — ABNORMAL LOW (ref 26.0–34.0)
MCHC: 30.2 g/dL (ref 30.0–36.0)
MCV: 73 fL — AB (ref 78.0–100.0)
PLATELETS: 256 10*3/uL (ref 150–400)
RBC: 4.85 MIL/uL (ref 3.87–5.11)
RDW: 16.2 % — ABNORMAL HIGH (ref 11.5–15.5)
WBC: 13.5 10*3/uL — AB (ref 4.0–10.5)

## 2014-09-27 LAB — URINE MICROSCOPIC-ADD ON

## 2014-09-27 LAB — GLUCOSE, CAPILLARY
GLUCOSE-CAPILLARY: 132 mg/dL — AB (ref 70–99)
GLUCOSE-CAPILLARY: 285 mg/dL — AB (ref 70–99)
GLUCOSE-CAPILLARY: 164 mg/dL — AB (ref 70–99)
Glucose-Capillary: 263 mg/dL — ABNORMAL HIGH (ref 70–99)

## 2014-09-27 LAB — CULTURE, RESPIRATORY

## 2014-09-27 LAB — CULTURE, RESPIRATORY W GRAM STAIN

## 2014-09-27 MED ORDER — BUDESONIDE-FORMOTEROL FUMARATE 160-4.5 MCG/ACT IN AERO
2.0000 | INHALATION_SPRAY | Freq: Two times a day (BID) | RESPIRATORY_TRACT | Status: DC
  Filled 2014-09-27 (×2): qty 6

## 2014-09-27 MED ORDER — BISACODYL 5 MG PO TBEC
5.0000 mg | DELAYED_RELEASE_TABLET | Freq: Once | ORAL | Status: AC
  Administered 2014-09-27: 5 mg via ORAL
  Filled 2014-09-27: qty 1

## 2014-09-27 MED ORDER — MORPHINE SULFATE 2 MG/ML IJ SOLN
1.0000 mg | INTRAMUSCULAR | Status: DC | PRN
  Administered 2014-09-28 – 2014-09-29 (×3): 2 mg via INTRAVENOUS
  Filled 2014-09-27 (×3): qty 1

## 2014-09-27 MED ORDER — IPRATROPIUM-ALBUTEROL 0.5-2.5 (3) MG/3ML IN SOLN
3.0000 mL | RESPIRATORY_TRACT | Status: DC | PRN
  Administered 2014-09-27 – 2014-09-30 (×2): 3 mL via RESPIRATORY_TRACT
  Filled 2014-09-27 (×3): qty 3

## 2014-09-27 MED ORDER — DOCUSATE SODIUM 100 MG PO CAPS
100.0000 mg | ORAL_CAPSULE | Freq: Every day | ORAL | Status: DC
  Administered 2014-09-27 – 2014-09-30 (×4): 100 mg via ORAL
  Filled 2014-09-27 (×4): qty 1

## 2014-09-27 MED ORDER — TRAMADOL HCL 50 MG PO TABS
100.0000 mg | ORAL_TABLET | Freq: Four times a day (QID) | ORAL | Status: DC | PRN
  Administered 2014-09-27 – 2014-09-30 (×6): 100 mg via ORAL
  Filled 2014-09-27 (×6): qty 2

## 2014-09-27 MED ORDER — INSULIN GLARGINE 100 UNIT/ML ~~LOC~~ SOLN
30.0000 [IU] | Freq: Every day | SUBCUTANEOUS | Status: DC
Start: 2014-09-27 — End: 2014-09-28
  Administered 2014-09-27: 30 [IU] via SUBCUTANEOUS
  Filled 2014-09-27 (×2): qty 0.3

## 2014-09-27 MED ORDER — TIOTROPIUM BROMIDE MONOHYDRATE 18 MCG IN CAPS
18.0000 ug | ORAL_CAPSULE | Freq: Every day | RESPIRATORY_TRACT | Status: DC
  Administered 2014-09-28 – 2014-09-30 (×3): 18 ug via RESPIRATORY_TRACT
  Filled 2014-09-27: qty 5

## 2014-09-27 NOTE — Progress Notes (Signed)
eLink Physician-Brief Progress Note Patient Name: Molly Crawford DOB: [birth year] MRN: [id number]   Date of Service  09/27/2014  HPI/Events of Note  No BM x 4 days  eICU Interventions  Colace, dulc x 1     Intervention Category Minor Interventions: Routine modifications to care plan (e.g. PRN medications for pain, fever)  Nelda BucksFEINSTEIN,Molly Crawford. 09/27/2014, 8:32 PM

## 2014-09-27 NOTE — Clinical Documentation Improvement (Signed)
Presents with Acute hypercarbic Respiratory Failure likely secondary to Copd Exacerbation; CKD documented.   Black Female  GFR's running from 265 to 5757  Please clarify the Stage of CKD from the list below and document findings in next progress note and include in discharge summary if applicable.  _______CKD Stage I - GFR > OR = 90 _______CKD Stage II - GFR 60-80 _______CKD Stage III - GFR 30-59 _______CKD Stage IV - GFR 15-29 _______CKD Stage V - GFR < 15 _______ESRD (End Stage Renal Disease) _______Other condition_____________  Jessie Foothank You, Don Tiu T Hasna Stefanik ,RN Clinical Documentation Specialist:  [phone]  [facility]- Health Information Management

## 2014-09-27 NOTE — Progress Notes (Signed)
Called by RN and asked to address pain issues with Mrs. Shorten.  She is complaining of moderate pain in left flank / rib region that has remained unchanged despite Tylenol.  She states she is uncomfortable and not able to even sit up or move about.  She has Acetaminophen 650mg  q6hrs PRN ordered. Will avoid NSAID given CKD. Will order low dose morphine q3hrs PRN.   Rutherford Guysahul Shamaine Mulkern, [place]PA - C [place] [facility] Pgr: [phone]  or [phone] 09/27/2014, 3:29 PM

## 2014-09-27 NOTE — Progress Notes (Signed)
PULMONARY / CRITICAL CARE MEDICINE   Name: Molly Crawford MRN: [id number] DOB: 05/31/1956    ADMISSION DATE:  09/24/2014 CONSULTATION DATE:  09/24/14   REFERRING MD :  EDP  CHIEF COMPLAINT:  Respiratory failure   INITIAL PRESENTATION:  59 y.o with remote hx of smoking presented with dyspnea, altered mental status, and VDRF.   She was visiting family, and is from [place] where she gets her medical care.  STUDIES:  CT head 1/29>>No acute intracranial process.  Minimal white matter changes can be seen with chronic small vessel ischemic disease.  SIGNIFICANT EVENTS: 1/29 admitted 1/30 extubated  1/31 transferred to tele  SUBJECTIVE:  Remains hypertensive.  Seen by PT 12/31, recommending home health PT as well as OT consult. Complaining of left flank pain that is new.  VITAL SIGNS: Temp:  [97.5 F (36.4 C)-98.9 F (37.2 C)] 97.8 F (36.6 C) (02/01 0419) Pulse Rate:  [96-111] 96 (02/01 0419) Resp:  [18-20] 20 (02/01 0419) BP: (162-173)/(88-104) 168/92 mmHg (02/01 0419) SpO2:  [97 %-100 %] 100 % (02/01 0752) Weight:  [95.981 kg (211 lb 9.6 oz)] 95.981 kg (211 lb 9.6 oz) (02/01 0419) INTAKE / OUTPUT:  Intake/Output Summary (Last 24 hours) at 09/27/14 1215 Last data filed at 09/27/14 1003  Gross per 24 hour  Intake    480 ml  Output   2125 ml  Net  -1645 ml    PHYSICAL EXAMINATION: General:  Sitting in chair, in NAD. Neuro:  A&O x 3, MAE'S. HEENT:  [place]/AT, MMM. Cardiovascular:  ST, no murmurs  Lungs:  Decreased aeration b/l lung fields, pursed lip breathing. Abdomen:  Obese, soft, ntnd, wnl bs.  Left CVA tenderness. Musculoskeletal:  Intact  Skin:  intact  LABS:  CBC  Recent Labs Lab 09/25/14 0220 09/26/14 0850 09/27/14 0338  WBC 12.0* 12.3* 13.5*  HGB 10.9* 10.5* 10.7*  HCT 34.6* 34.5* 35.4*  PLT 285 280 256   Coag's  Recent Labs Lab 09/25/14 0220  INR 0.93   BMET  Recent Labs Lab 09/25/14 0220 09/26/14 0850 09/27/14 0338  NA 140 141 137  K 3.3*  3.3* 4.0  CL 98 106 100  CO2 37* 27 31  BUN CREATININE 1.08 0.95 1.20*  GLUCOSE 390* 149* 318*   Electrolytes  Recent Labs Lab 09/25/14 0220 09/26/14 0850 09/27/14 0338  CALCIUM 8.0* 8.0* 8.5  MG 1.9 1.6  --   PHOS 3.8 2.7  --    Sepsis Markers  Recent Labs Lab 09/25/14 0220 09/26/14 0900  LATICACIDVEN 2.2* 0.8  PROCALCITON 0.41  --    ABG  Recent Labs Lab 09/24/14 2230 09/25/14 0005 09/25/14 0413  PHART 7.054* 7.382 7.403  PCO2ART 99.7* 54.9* 54.4*  PO2ART 335.0* 167.0* 122.0*   Liver Enzymes  Recent Labs Lab 09/24/14 2223 09/25/14 0220 09/26/14 0850  AST 89* 54* 31  ALT 32 29 25  ALKPHOS 116 102 85  BILITOT 0.3 0.5 0.6  ALBUMIN 2.7* 2.4* 2.2*   Cardiac Enzymes  Recent Labs Lab 09/25/14 1330 09/25/14 1801 09/25/14 2336  TROPONINI 0.09* 0.10* 0.07*   Glucose  Recent Labs Lab 09/26/14 0700 09/26/14 1132 09/26/14 1609 09/26/14 2105 09/27/14 0614 09/27/14 1115  GLUCAP 139* 152* 234* 318* 263* 132*    Imaging Dg Chest Port 1 View  09/26/2014   CLINICAL DATA:  Shortness of breath.  EXAM: PORTABLE CHEST - 1 VIEW  COMPARISON:  September 25, 2014.  FINDINGS: Endotracheal and nasogastric tubes have been removed. Stable  cardiomediastinal silhouette. No pneumothorax or significant pleural effusion is noted. Degenerative change of mid thoracic spine is noted. Stable mild bibasilar subsegmental atelectasis is noted. Bony thorax appears intact.  IMPRESSION: Stable mild bibasilar subsegmental atelectasis. No other acute abnormality seen.   Electronically Signed   By: Roque LiasJames  Green M.D.   On: 09/26/2014 08:25    ASSESSMENT / PLAN:  PULMONARY OETT 1/29>1/30 A: Acute on chronic hypercapnic respiratory failure 2nd to asthma exacerbation with hx of OSA, and tobacco abuse.  She wears 4 liters oxygen at home >> ?OHS and COPD. P:   Resume symbicort, spiriva PRN albuterol Wean off prednisone as tolerated Will need outpt PFT's Oxygen to keep SaO2  88 to 94% CPAP qhs  CARDIOVASCULAR A:  Sinus tachycardia - resolved. Elevated troponin - demand ischemia. Hx of HTN. P:  Will add Cozaar 50 mg, Toprol XL 50 mg.   Holding Procardia 30 mg XL, Verapamil 120 XL for now Prn Hydralazine 5mg  q4hrs prn sbp >170  RENAL A:   Hypokalemia >> resolved. CKD II - III. P:   Monitor renal fx, urine outpt, electrolytes  GASTROINTESTINAL A:   Nutrition. Lt flank pain. P:   Carb mod diet Check u/a  HEMATOLOGIC A:   DVT px.  Microcytic anemia.  P:  Lovenox sq Trend CBC-will need outpatient f/u with colonoscopy if not had already   INFECTIOUS A:   Asthma exacerbation. P:   Day 2 of levaquin  Blood 1/30 >> MRSA 1/30 >> POSITIVE  ENDOCRINE A:   Hyperglycemia improved with h/o DM.  P:   SSI-R, Lantus increased from 20u to 30u QHS (home dose 60 units QHS and Novolog 15 u tid), Novolog 4 tid with meals  NEUROLOGIC A:   Acute encephalopathy - resolved.  Headache. P:   Prn Tylenol   Rutherford Guysahul Desai, PA - C [place] [facility] Pgr: [phone]  or [phone] 09/27/2014, 12:39 PM  Reviewed above, examined.  Breathing improving.  She still has chest congestion.  Faint wheeze on exam.  Continue CPAP, oxygen.  Transition to inhalers.  F/u U/A >> keep foley in for now.  Might be ready for d/c in next 24 to 48 hrs.  She will likely need outpt f/u with [place] prior to her going home to [place].  Coralyn HellingVineet Jasmene Goswami, MD [facility] 09/27/2014, 3:37 PM Pager:  [phone] After 3pm call: [phone]

## 2014-09-27 NOTE — Plan of Care (Addendum)
Problem: Phase I Progression Outcomes Goal: Hemodynamically stable Outcome: Progressing BP remains mildly elevated.  Asymptomatic.  No PRN med given for BP.  Continue with IV antibiotic and bolus doses of Lasix.  Will continue to monitor patient condition.

## 2014-09-27 NOTE — Progress Notes (Signed)
Physical Therapy Treatment Patient Details Name: Molly DanielLinda Crawford MRN: [id number] DOB: 08-05-1956 Today's Date: 09/27/2014    History of Present Illness Patient is a 59 yo female admitted 09/24/14 with COPD exacerbation, respiratory failure, intubated 09/24/14, extubated 09/25/14.  PMH:  DM, asthma, CHF, COPD on home O2    PT Comments    Pt refused to ambulate again today due to nausea and SOB, but was agreeable to standing therex for strengthening and activity tolerance.  Educated on importance of OOB activity and increasing mobility.  Follow Up Recommendations  Home health PT;Supervision for mobility/OOB     Equipment Recommendations  3in1 (PT)    Recommendations for Other Services       Precautions / Restrictions Precautions Precautions: Fall    Mobility  Bed Mobility               General bed mobility comments:  (sitting EOB upon arrival)  Transfers Overall transfer level: Needs assistance Equipment used: Rolling walker (2 wheeled) Transfers: Sit to/from [employer]Stand;Stand Pivot Transfers Sit to Stand: Min guard Stand pivot transfers: Min guard       General transfer comment: tactile cueing for proper hand placement after verbal instruction did not work.  Ambulation/Gait             General Gait Details: Pt refusing to ambulate due to feeling SOB and nauseous.  O2 100% on o2 and 96% after therex.   Stairs            Wheelchair Mobility    Modified Rankin (Stroke Patients Only)       Balance     Sitting balance-Leahy Scale: Good     Standing balance support: Bilateral upper extremity supported Standing balance-Leahy Scale: Fair                      Cognition Arousal/Alertness: Awake/alert Behavior During Therapy: Flat affect;WFL for tasks assessed/performed Overall Cognitive Status: Within Functional Limits for tasks assessed                      Exercises General Exercises - Lower Extremity Hip Flexion/Marching: Standing;20  reps Mini-Sqauts: 10 reps;Standing;Strengthening Other Exercises Other Exercises: Instructed in seated therex to perform throughout the day    General Comments        Pertinent Vitals/Pain Pain Assessment: No/denies pain (c/o nausea)    Home Living                      Prior Function            PT Goals (current goals can now be found in the care plan section) Acute Rehab PT Goals Patient Stated Goal: To stop nausea.  To get stronger PT Goal Formulation: With patient/family Time For Goal Achievement: 10/03/14 Potential to Achieve Goals: Good Progress towards PT goals: Progressing toward goals    Frequency  Min 3X/week    PT Plan Current plan remains appropriate    Co-evaluation             End of Session Equipment Utilized During Treatment: Oxygen Activity Tolerance: Patient limited by fatigue;Other (comment) (nausea) Patient left: in chair;with call bell/phone within reach;with nursing/sitter in room     Time: [id number] PT Time Calculation (min) (ACUTE ONLY): 13 min  Charges:  $Therapeutic Exercise: 8-22 mins                    G Codes:  Molly Crawford 09/27/2014, 12:28 PM

## 2014-09-28 DIAGNOSIS — E1165 Type 2 diabetes mellitus with hyperglycemia: Secondary | ICD-10-CM

## 2014-09-28 DIAGNOSIS — N182 Chronic kidney disease, stage 2 (mild): Secondary | ICD-10-CM

## 2014-09-28 DIAGNOSIS — E1122 Type 2 diabetes mellitus with diabetic chronic kidney disease: Secondary | ICD-10-CM

## 2014-09-28 DIAGNOSIS — J441 Chronic obstructive pulmonary disease with (acute) exacerbation: Principal | ICD-10-CM

## 2014-09-28 LAB — RESPIRATORY VIRUS PANEL
Adenovirus: NEGATIVE
INFLUENZA A: NEGATIVE
Influenza B: NEGATIVE
Metapneumovirus: NEGATIVE
PARAINFLUENZA 2 A: NEGATIVE
Parainfluenza 1: NEGATIVE
Parainfluenza 3: NEGATIVE
RESPIRATORY SYNCYTIAL VIRUS A: NEGATIVE
RHINOVIRUS: NEGATIVE
Respiratory Syncytial Virus B: NEGATIVE

## 2014-09-28 LAB — GLUCOSE, CAPILLARY
GLUCOSE-CAPILLARY: 211 mg/dL — AB (ref 70–99)
GLUCOSE-CAPILLARY: 304 mg/dL — AB (ref 70–99)
Glucose-Capillary: 199 mg/dL — ABNORMAL HIGH (ref 70–99)
Glucose-Capillary: 146 mg/dL — ABNORMAL HIGH (ref 70–99)

## 2014-09-28 LAB — CBC
HCT: 32.9 % — ABNORMAL LOW (ref 36.0–46.0)
HEMOGLOBIN: 10.4 g/dL — AB (ref 12.0–15.0)
MCH: 22.7 pg — AB (ref 26.0–34.0)
MCHC: 31.6 g/dL (ref 30.0–36.0)
MCV: 71.7 fL — ABNORMAL LOW (ref 78.0–100.0)
Platelets: 266 10*3/uL (ref 150–400)
RBC: 4.59 MIL/uL (ref 3.87–5.11)
RDW: 16 % — ABNORMAL HIGH (ref 11.5–15.5)
WBC: 11.1 10*3/uL — ABNORMAL HIGH (ref 4.0–10.5)

## 2014-09-28 LAB — BASIC METABOLIC PANEL
Anion gap: 4 — ABNORMAL LOW (ref 5–15)
BUN: 22 mg/dL (ref 6–23)
CO2: 33 mmol/L — AB (ref 19–32)
CREATININE: 1.43 mg/dL — AB (ref 0.50–1.10)
Calcium: 8.8 mg/dL (ref 8.4–10.5)
Chloride: 101 mmol/L (ref 96–112)
GFR calc Af Amer: 46 mL/min — ABNORMAL LOW (ref >90)
GFR calc non Af Amer: 40 mL/min — ABNORMAL LOW (ref >90)
Glucose, Bld: 195 mg/dL — ABNORMAL HIGH (ref 70–99)
Potassium: 4.2 mmol/L (ref 3.5–5.1)
SODIUM: 138 mmol/L (ref 135–145)

## 2014-09-28 LAB — MAGNESIUM: MAGNESIUM: 2.2 mg/dL (ref 1.5–2.5)

## 2014-09-28 LAB — PHOSPHORUS: Phosphorus: 3.4 mg/dL (ref 2.3–4.6)

## 2014-09-28 MED ORDER — INSULIN GLARGINE 100 UNIT/ML ~~LOC~~ SOLN
40.0000 [IU] | Freq: Every day | SUBCUTANEOUS | Status: DC
  Administered 2014-09-28 – 2014-09-29 (×2): 40 [IU] via SUBCUTANEOUS
  Filled 2014-09-28 (×4): qty 0.4

## 2014-09-28 MED ORDER — LEVOFLOXACIN IN D5W 750 MG/150ML IV SOLN
750.0000 mg | INTRAVENOUS | Status: DC
  Administered 2014-09-30: 750 mg via INTRAVENOUS
  Filled 2014-09-28: qty 150

## 2014-09-28 NOTE — Progress Notes (Signed)
Physical Therapy Treatment Patient Details Name: Molly Crawford MRN: [id number] DOB: 1955-10-10 Today's Date: 09/28/2014    History of Present Illness Patient is a 59 yo female admitted 09/24/14 with COPD exacerbation, respiratory failure, intubated 09/24/14, extubated 09/25/14.  PMH:  DM, asthma, CHF, COPD on home O2 (3 L)    PT Comments    Patient agreeable to ambulation this session but very limited by activity tolerance and DOE.  Educated patient regarding mobility as well as controlled breathing techniques for relaxation during activity. Patient receptive and appreciative. Will continue to see and progress as tolerated.   Follow Up Recommendations  Home health PT;Supervision for mobility/OOB     Equipment Recommendations  3in1 (PT)    Recommendations for Other Services       Precautions / Restrictions Precautions Precautions: Fall Restrictions Weight Bearing Restrictions: No    Mobility  Bed Mobility Overal bed mobility: Modified Independent Bed Mobility: Supine to Sit     Supine to sit: Modified independent (Device/Increase time);HOB elevated     General bed mobility comments: received up in chair  Transfers Overall transfer level: Needs assistance Equipment used: Rolling walker (2 wheeled) Transfers: Sit to/from Stand Sit to Stand: Supervision         General transfer comment: VCs for safe hand placement  Ambulation/Gait Ambulation/Gait assistance: Supervision;Min guard Ambulation Distance (Feet): 45 Feet Assistive device: Rolling walker (2 wheeled) Gait Pattern/deviations: Step-to pattern;Decreased stride length;Shuffle Gait velocity: significantly decreased 4 standing rest breaks and max encouragement to continue ambulation Gait velocity interpretation: <1.8 ft/sec, indicative of risk for recurrent falls General Gait Details: ambualted on 3 liters, SpO2 >96% continuously despite patient with increased DOE roughly 3/4. Multiple standing rest breaks for  activity tolerance.    Stairs            Wheelchair Mobility    Modified Rankin (Stroke Patients Only)       Balance Overall balance assessment: Needs assistance Sitting-balance support: No upper extremity supported;Feet supported Sitting balance-Leahy Scale: Good     Standing balance support: Bilateral upper extremity supported (intermittent reliance on RW) Standing balance-Leahy Scale: Fair                      Cognition Arousal/Alertness: Awake/alert Behavior During Therapy: WFL for tasks assessed/performed Overall Cognitive Status: Within Functional Limits for tasks assessed                      Exercises Other Exercises Other Exercises: Ankle ROM, patient reports she has been doing this on her own as well    General Comments General comments (skin integrity, edema, etc.): spoke with patient at length regarding pursed lip breathing as well as relaxation and systamatic breathing. Educated patient on )2 saturations and HR comparisons in conjunction with her increased DOE.       Pertinent Vitals/Pain Pain Assessment: 0-10 Pain Score: 5  Pain Location: low back  Pain Descriptors / Indicators: Aching Pain Intervention(s): Limited activity within patient's tolerance;Premedicated before session;Relaxation    Home Living Family/patient expects to be discharged to:: Private residence Living Arrangements: Alone (here from [place] visiting family ) Available Help at Discharge: Family;Available 24 hours/day Type of Home: House Home Access: Stairs to enter Entrance Stairs-Rails: None Home Layout: One level Home Equipment: Adaptive equipment      Prior Function Level of Independence: Independent;Needs assistance  Gait / Transfers Assistance Needed: Independent with ambulation household distances.  Minimal out of house activity ADL's / Homemaking Assistance Needed:  Pt reports on good days she can do her BADLs by herself (B/D) on bad days she does only the  minimum or skips B/D all together. Unable to do housework     PT Goals (current goals can now be found in the care plan section) Acute Rehab PT Goals Patient Stated Goal: to go home sooner than later PT Goal Formulation: With patient/family Time For Goal Achievement: 10/03/14 Potential to Achieve Goals: Good Progress towards PT goals: Progressing toward goals    Frequency  Min 3X/week    PT Plan Current plan remains appropriate    Co-evaluation             End of Session Equipment Utilized During Treatment: Oxygen (3 liters continuous) Activity Tolerance: Patient limited by fatigue;Other (comment) Patient left: in chair;with call bell/phone within reach;with nursing/sitter in room     Time: 1107-1126 PT Time Calculation (min) (ACUTE ONLY): 19 min  Charges:  $Gait Training: 8-22 mins                    G CodesFabio Asa:      Dan Dissinger J 09/28/2014, 1:27 PM Charlotte Crumbevon Marwah Disbro, PT DPT  [phone]

## 2014-09-28 NOTE — Progress Notes (Signed)
Placed patient on CPAP via FFM, 12.0 cm H20 per home settings, with 2 pm O2 bleed in.  Patient tolerating well at this time.

## 2014-09-28 NOTE — Progress Notes (Signed)
RT Note:  CPAP set up at bedside.  Patient not ready to be placed on CPAP at this time. RT will follow up once patient is back to bed.

## 2014-09-28 NOTE — Progress Notes (Signed)
Pt placed on CPAP set at 12 CMH20 per Pt home settings with 3.5LPM O2 bleed in.  Pt tolerating well at this time, RT to monitor and assess as needed.

## 2014-09-28 NOTE — Evaluation (Signed)
Occupational Therapy Evaluation and Discharge Patient Details Name: Molly DanielLinda Cassidy MRN: [id number] DOB: 1956/02/26 Today's Date: 09/28/2014    History of Present Illness Patient is a 59 yo female admitted 09/24/14 with COPD exacerbation, respiratory failure, intubated 09/24/14, extubated 09/25/14.  PMH:  DM, asthma, CHF, COPD on home O2 (3 L)   Clinical Impression   This 59 yo female admitted with above presents to acute OT at a S level except for LBB/D for her lower legs and feet/socks --currently she says while staying with her family here she is not wearing socks only slip on shoes and having family A prn for washing her feet. No further acute OT needs identified, however [facility] any problems she may encounter in a home environment. Acute OT will sign off.    Follow Up Recommendations  Home health OT    Equipment Recommendations   (tub seat--but insurance will not cover, made pt aware, she says she will get one on her own)       Precautions / Restrictions Precautions Precautions: Fall Restrictions Weight Bearing Restrictions: No      Mobility Bed Mobility Overal bed mobility: Modified Independent Bed Mobility: Supine to Sit     Supine to sit: Modified independent (Device/Increase time);HOB elevated        Transfers Overall transfer level: Needs assistance Equipment used: Rolling walker (2 wheeled) Transfers: Sit to/from Stand Sit to Stand: Supervision         General transfer comment: VCs for safe hand placement    Balance Overall balance assessment: Needs assistance Sitting-balance support: No upper extremity supported;Feet supported Sitting balance-Leahy Scale: Good     Standing balance support: Bilateral upper extremity supported Standing balance-Leahy Scale: Fair                              ADL Overall ADL's : Needs assistance/impaired Eating/Feeding: Independent;Sitting   Grooming: Oral care;Set  up;Standing   Upper Body Bathing: Set up;Sitting   Lower Body Bathing: Minimal assistance (with S sit<>stand)   Upper Body Dressing : Set up;Sitting   Lower Body Dressing: Minimal assistance (with S sit<>stand)   Toilet Transfer: Supervision/safety;Ambulation;RW (bed>around to sink>back around to recliner)   Toileting- Clothing Manipulation and Hygiene: Supervision/safety;Sit to/from stand                         Pertinent Vitals/Pain Pain Assessment: 0-10 Pain Score: 9  Pain Location: down the back of her left side Pain Descriptors / Indicators: Aching;Sore Pain Intervention(s): Monitored during session;Repositioned;Patient requesting pain meds-RN notified     Hand Dominance Right   Extremity/Trunk Assessment Upper Extremity Assessment Upper Extremity Assessment: Overall WFL for tasks assessed           Communication Communication Communication: No difficulties   Cognition Arousal/Alertness: Awake/alert Behavior During Therapy: WFL for tasks assessed/performed Overall Cognitive Status: Within Functional Limits for tasks assessed                                Home Living Family/patient expects to be discharged to:: Private residence Living Arrangements: Alone (here from [place] visiting family ) Available Help at Discharge: Family;Available 24 hours/day Type of Home: House Home Access: Stairs to enter Enter[employer] CorporationEntrance Stairs-Number of Steps: 4 Entrance Stairs-Rails: None Home Layout: One level     Bathroom Shower/Tub: Tub/shower unit;Curtain  Shower/tub characteristics: Engineer, building services: Standard     Home Equipment: Mudlogger: Sock aid (in [place])        Prior Functioning/Environment Level of Independence: Independent;Needs assistance  Gait / Transfers Assistance Needed: Independent with ambulation household distances.  Minimal out of house activity ADL's / Homemaking Assistance Needed: Pt reports on good days she  can do her BADLs by herself (B/D) on bad days she does only the minimum or skips B/D all together. Unable to do housework        OT Diagnosis: Generalized weakness;Acute pain   OT Problem List: Decreased strength;Decreased activity tolerance;Impaired balance (sitting and/or standing);Pain;Obesity      OT Goals(Current goals can be found in the care plan section) Acute Rehab OT Goals Patient Stated Goal: to go home sooner than later  OT Frequency:                End of Session Equipment Utilized During Treatment: Rolling walker;Oxygen (at 3 liters) Nurse Communication: Patient requests pain meds  Activity Tolerance: Patient tolerated treatment well Patient left: in chair;with call bell/phone within reach   Time: 0855-0930 OT Time Calculation (min): 35 min Charges:  OT General Charges $OT Visit: 1 Procedure OT Evaluation $Initial OT Evaluation Tier I: 1 Procedure OT Treatments $Self Care/Home Management : 8-22 mins  Evette Georges [phone] 09/28/2014, 9:41 AM

## 2014-09-28 NOTE — Plan of Care (Signed)
Problem: Phase I Progression Outcomes Goal: Hemodynamically stable Outcome: Progressing Foley cath removed.  Continues to diurese.  Respiratory status improved but remains with dyspnea on exertion.  Will continue to monitor patient condition.

## 2014-09-28 NOTE — Progress Notes (Signed)
PULMONARY / CRITICAL CARE MEDICINE   Name: Molly Crawford MRN: [id number] DOB: [birth year]    ADMISSION DATE:  09/24/2014 CONSULTATION DATE:  09/24/14   REFERRING MD :  EDP  CHIEF COMPLAINT:  Respiratory failure   INITIAL PRESENTATION:  59 y.o with remote hx of smoking presented with dyspnea, altered mental status, and VDRF.   She was visiting family, and is from [place] where she gets her medical care.  STUDIES:  CT head 1/29>>No acute intracranial process.  Minimal white matter changes can be seen with chronic small vessel ischemic disease.  SIGNIFICANT EVENTS: 1/29 admitted 1/30 extubated  1/31 transferred to tele Seen by PT 12/31, recommending home health PT  SUBJECTIVE:  Out of bed to chair Denies pain Breathing improved   VITAL SIGNS: Temp:  [97.2 F (36.2 C)-99 F (37.2 C)] 97.2 F (36.2 C) (02/02 0614) Pulse Rate:  [82-89] 86 (02/02 1436) Resp:  [18] 18 (02/02 1436) BP: (122-136)/(68-77) 125/77 mmHg (02/02 1436) SpO2:  [98 %-100 %] 100 % (02/02 1436) Weight:  [208 lb 8.9 oz (94.6 kg)] 208 lb 8.9 oz (94.6 kg) (02/02 4098) INTAKE / OUTPUT:  Intake/Output Summary (Last 24 hours) at 09/28/14 1727 Last data filed at 09/28/14 1436  Gross per 24 hour  Intake    784 ml  Output    400 ml  Net    384 ml    PHYSICAL EXAMINATION: General:  Sitting in chair, in NAD. Neuro:  A&O x 3, MAE'S. HEENT:  [place]/AT, MMM. Cardiovascular:  ST, no murmurs  Lungs:  Decreased aeration b/l lung fields, scattered rhonchi ,pursed lip breathing. Abdomen:  Obese, soft, ntnd, wnl bs.  Left CVA tenderness. Musculoskeletal:  Intact  Skin:  intact  LABS:  CBC  Recent Labs Lab 09/26/14 0850 09/27/14 0338 09/28/14 0340  WBC 12.3* 13.5* 11.1*  HGB 10.5* 10.7* 10.4*  HCT 34.5* 35.4* 32.9*  PLT 280 256 266   Coag's  Recent Labs Lab 09/25/14 0220  INR 0.93   BMET  Recent Labs Lab 09/26/14 0850 09/27/14 0338 09/28/14 0340  NA 141 137 138  K 3.3* 4.0 4.2  CL 106 100 101   CO2 27 31 33*  BUN CREATININE 0.95 1.20* 1.43*  GLUCOSE 149* 318* 195*   Electrolytes  Recent Labs Lab 09/25/14 0220 09/26/14 0850 09/27/14 0338 09/28/14 0340  CALCIUM 8.0* 8.0* 8.5 8.8  MG 1.9 1.6  --  2.2  PHOS 3.8 2.7  --  3.4   Sepsis Markers  Recent Labs Lab 09/25/14 0220 09/26/14 0900  LATICACIDVEN 2.2* 0.8  PROCALCITON 0.41  --    ABG  Recent Labs Lab 09/24/14 2230 09/25/14 0005 09/25/14 0413  PHART 7.054* 7.382 7.403  PCO2ART 99.7* 54.9* 54.4*  PO2ART 335.0* 167.0* 122.0*   Liver Enzymes  Recent Labs Lab 09/24/14 2223 09/25/14 0220 09/26/14 0850  AST 89* 54* 31  ALT 32 29 25  ALKPHOS 116 102 85  BILITOT 0.3 0.5 0.6  ALBUMIN 2.7* 2.4* 2.2*   Cardiac Enzymes  Recent Labs Lab 09/25/14 1330 09/25/14 1801 09/25/14 2336  TROPONINI 0.09* 0.10* 0.07*   Glucose  Recent Labs Lab 09/27/14 1115 09/27/14 1626 09/27/14 2111 09/28/14 0625 09/28/14 1113 09/28/14 1701  GLUCAP 132* 285* 164* 199* 146* 211*    Imaging No results found.  ASSESSMENT / PLAN:  PULMONARY OETT 1/29>1/30 A: Acute on chronic hypercapnic respiratory failure 2nd to asthma exacerbation with hx of OSA, and tobacco abuse.  She wears 4 liters  oxygen at home >> ?OHS and COPD. P:   Resume symbicort, spiriva on discharge PRN albuterol Wean off prednisone as tolerated Will need outpt PFT's Oxygen to keep SaO2 88 to 94% CPAP qhs 12 cm  CARDIOVASCULAR A:  Sinus tachycardia - resolved. Elevated troponin - demand ischemia. Hx of HTN. P:  added Cozaar 50 mg, Toprol XL 50 mg.   Holding Procardia 30 mg XL, Verapamil 120 XL for now Prn Hydralazine 5mg  q4hrs prn sbp >170  RENAL A:   Hypokalemia >> resolved. CKD II - III. P:   Monitor renal fx on Cozaar, urine outpt, electrolytes  GASTROINTESTINAL A:   Nutrition. Lt flank pain. P:   Carb mod diet  HEMATOLOGIC A:   DVT px.  Microcytic anemia.  P:  Lovenox sq Trend CBC-will need outpatient f/u  with colonoscopy if not had already   INFECTIOUS A:   Asthma exacerbation. P:   levaquin Viral panel >> negative Blood 1/30 >> negative MRSA 1/30 >> POSITIVE  ENDOCRINE A:   Hyperglycemia improved with h/o DM.  P:   SSI-R, Lantus increased from 20u to 40u QHS (home dose 60 units QHS and Novolog 15 u tid), Novolog 4 tid with meals  NEUROLOGIC A:   Acute encephalopathy - resolved.  Headache. P:   Prn Tylenol    She will likely need outpt f/u with [place] prior to her going home to [place].   Cyril Mourningakesh Gavan Nordby MD. Tonny BollmanFCCP. [facility] Pager [phone] If no response call [phone]    09/28/2014, 5:27 PM

## 2014-09-29 LAB — BASIC METABOLIC PANEL
ANION GAP: 7 (ref 5–15)
BUN: 29 mg/dL — AB (ref 6–23)
CHLORIDE: 101 mmol/L (ref 96–112)
CO2: 29 mmol/L (ref 19–32)
Calcium: 8.9 mg/dL (ref 8.4–10.5)
Creatinine, Ser: 1.41 mg/dL — ABNORMAL HIGH (ref 0.50–1.10)
GFR calc Af Amer: 47 mL/min — ABNORMAL LOW (ref >90)
GFR calc non Af Amer: 40 mL/min — ABNORMAL LOW (ref >90)
GLUCOSE: 179 mg/dL — AB (ref 70–99)
Potassium: 5 mmol/L (ref 3.5–5.1)
Sodium: 137 mmol/L (ref 135–145)

## 2014-09-29 LAB — CBC
HCT: 35.2 % — ABNORMAL LOW (ref 36.0–46.0)
Hemoglobin: 11.4 g/dL — ABNORMAL LOW (ref 12.0–15.0)
MCH: 23.1 pg — ABNORMAL LOW (ref 26.0–34.0)
MCHC: 32.4 g/dL (ref 30.0–36.0)
MCV: 71.3 fL — AB (ref 78.0–100.0)
PLATELETS: 304 10*3/uL (ref 150–400)
RBC: 4.94 MIL/uL (ref 3.87–5.11)
RDW: 16.1 % — AB (ref 11.5–15.5)
WBC: 10.9 10*3/uL — AB (ref 4.0–10.5)

## 2014-09-29 LAB — GLUCOSE, CAPILLARY
GLUCOSE-CAPILLARY: 177 mg/dL — AB (ref 70–99)
GLUCOSE-CAPILLARY: 66 mg/dL — AB (ref 70–99)
Glucose-Capillary: 85 mg/dL (ref 70–99)
Glucose-Capillary: 175 mg/dL — ABNORMAL HIGH (ref 70–99)
Glucose-Capillary: 347 mg/dL — ABNORMAL HIGH (ref 70–99)

## 2014-09-29 MED ORDER — GUAIFENESIN 100 MG/5ML PO SYRP
200.0000 mg | ORAL_SOLUTION | ORAL | Status: DC | PRN
  Administered 2014-09-30 (×2): 200 mg via ORAL
  Filled 2014-09-29 (×3): qty 10

## 2014-09-29 MED ORDER — FUROSEMIDE 10 MG/ML IJ SOLN
40.0000 mg | Freq: Once | INTRAMUSCULAR | Status: AC
  Administered 2014-09-29: 40 mg via INTRAVENOUS

## 2014-09-29 MED ORDER — POLYETHYLENE GLYCOL 3350 17 G PO PACK
17.0000 g | PACK | Freq: Every day | ORAL | Status: DC
  Administered 2014-09-30 (×2): 17 g via ORAL
  Filled 2014-09-29 (×3): qty 1

## 2014-09-29 NOTE — Progress Notes (Signed)
Orders given for robitussin and miralax for patient complained of constipation and right before doctor called wanted something for cough. Will administer as ordered and continue to monitor patient to end of shift.

## 2014-09-29 NOTE — Progress Notes (Signed)
Patient complains of constipation and has not had a bowel movement since 09/25/2014, paged on-call doctor. Waiting for return page and will continue to monitor to end of shift.

## 2014-09-29 NOTE — Progress Notes (Addendum)
PULMONARY / CRITICAL CARE MEDICINE   Name: Molly DanielLinda Crawford MRN: [id number] DOB: [birth year]    ADMISSION DATE:  09/24/2014 CONSULTATION DATE:  09/24/14   REFERRING MD :  EDP  CHIEF COMPLAINT:  Respiratory failure   INITIAL PRESENTATION:  59 y.o with remote hx of smoking presented with dyspnea, altered mental status, and VDRF.   She was visiting family, and is from [place] where she gets her medical care.  STUDIES:  CT head 1/29>>No acute intracranial process.  Minimal white matter changes can be seen with chronic small vessel ischemic disease.  SIGNIFICANT EVENTS: 1/29 admitted 1/30 extubated  1/31 transferred to tele Seen by PT 12/31, recommending home health PT  SUBJECTIVE:  Complains of dyspnea Denies pain Used CPAP overnight   VITAL SIGNS: Temp:  [98 F (36.7 C)-98.3 F (36.8 C)] 98.3 F (36.8 C) (02/03 1000) Pulse Rate:  [79-86] 86 (02/03 1000) Resp:  [18] 18 (02/03 1000) BP: (125-157)/(74-91) 141/91 mmHg (02/03 1000) SpO2:  [94 %-100 %] 99 % (02/03 1000) Weight:  [96.3 kg (212 lb 4.9 oz)] 96.3 kg (212 lb 4.9 oz) (02/03 0610) INTAKE / OUTPUT:  Intake/Output Summary (Last 24 hours) at 09/29/14 1257 Last data filed at 09/29/14 1141  Gross per 24 hour  Intake   1182 ml  Output   1100 ml  Net     82 ml    PHYSICAL EXAMINATION: General:  Sitting in chair, in NAD. Neuro:  A&O x 3, MAE'S. HEENT:  [place]/AT, MMM. Cardiovascular:  ST, no murmurs  Lungs:  Decreased aeration b/l lung fields, scattered rhonchi ,pursed lip breathing. Abdomen:  Obese, soft, ntnd, wnl bs.  Left CVA tenderness. Musculoskeletal:  Intact  Skin:  intact  LABS:  CBC  Recent Labs Lab 09/27/14 0338 09/28/14 0340 09/29/14 0540  WBC 13.5* 11.1* 10.9*  HGB 10.7* 10.4* 11.4*  HCT 35.4* 32.9* 35.2*  PLT 256 266 304   Coag's  Recent Labs Lab 09/25/14 0220  INR 0.93   BMET  Recent Labs Lab 09/27/14 0338 09/28/14 0340 09/29/14 0540  NA 137 138 137  K 4.0 4.2 5.0  CL 100 101 101   CO2 31 33* 29  BUN 15 22 29*  CREATININE 1.20* 1.43* 1.41*  GLUCOSE 318* 195* 179*   Electrolytes  Recent Labs Lab 09/25/14 0220 09/26/14 0850 09/27/14 0338 09/28/14 0340 09/29/14 0540  CALCIUM 8.0* 8.0* 8.5 8.8 8.9  MG 1.9 1.6  --  2.2  --   PHOS 3.8 2.7  --  3.4  --    Sepsis Markers  Recent Labs Lab 09/25/14 0220 09/26/14 0900  LATICACIDVEN 2.2* 0.8  PROCALCITON 0.41  --    ABG  Recent Labs Lab 09/24/14 2230 09/25/14 0005 09/25/14 0413  PHART 7.054* 7.382 7.403  PCO2ART 99.7* 54.9* 54.4*  PO2ART 335.0* 167.0* 122.0*   Liver Enzymes  Recent Labs Lab 09/24/14 2223 09/25/14 0220 09/26/14 0850  AST 89* 54* 31  ALT 32 29 25  ALKPHOS 116 102 85  BILITOT 0.3 0.5 0.6  ALBUMIN 2.7* 2.4* 2.2*   Cardiac Enzymes  Recent Labs Lab 09/25/14 1330 09/25/14 1801 09/25/14 2336  TROPONINI 0.09* 0.10* 0.07*   Glucose  Recent Labs Lab 09/28/14 1113 09/28/14 1701 09/28/14 2104 09/29/14 0609 09/29/14 1137 09/29/14 1205  GLUCAP 146* 211* 304* 177* 66* 85    Imaging No results found.  ASSESSMENT / PLAN:  PULMONARY OETT 1/29>1/30 A: Acute on chronic hypercapnic respiratory failure 2nd to asthma exacerbation with hx of OSA, and  tobacco abuse.  She wears 4 liters oxygen at home >> ?OHS and COPD. P:   Resume symbicort, spiriva on discharge PRN albuterol Wean off prednisone as tolerated Will need outpt PFT's Oxygen to keep SaO2 88 to 94% CPAP qhs 12 cm  CARDIOVASCULAR A:  Sinus tachycardia - resolved. Elevated troponin - demand ischemia. Hx of HTN. P:  ct Toprol XL 50 mg.   Holding Procardia 30 mg XL, Verapamil 120 XL for now Prn Hydralazine  q4hrs prn sbp >170  RENAL A:   Hypokalemia >> resolved. CKD II - III. P:   Monitor renal fx , urine outpt, electrolytes Hold Cozaar Lasix 401  GASTROINTESTINAL A:   Nutrition. Lt flank pain. P:   Carb mod diet  HEMATOLOGIC A:   DVT px.  Microcytic anemia.  P:  Lovenox  sq   INFECTIOUS A:   Asthma exacerbation. P:   levaquin Viral panel >> negative Blood 1/30 >> negative MRSA 1/30 >> POSITIVE  ENDOCRINE A:   Hyperglycemia improved with h/o DM.  P:   SSI-R, Lantus increased from 20u to 40u QHS (home dose 60 units QHS and Novolog 15 u tid), Novolog 4 tid with meals  NEUROLOGIC A:   Acute encephalopathy - resolved.  Headache. P:   Prn Tylenol    She will likely need outpt f/u with [place] prior to her going home to [place]. Hope for discharge in 24 hours   Cyril Mourning MD. Tonny Bollman. [facility] Pager [phone] If no response call [phone]    09/29/2014, 12:57 PM

## 2014-09-30 LAB — BASIC METABOLIC PANEL
Anion gap: 9 (ref 5–15)
BUN: 32 mg/dL — ABNORMAL HIGH (ref 6–23)
CO2: 29 mmol/L (ref 19–32)
Calcium: 8.8 mg/dL (ref 8.4–10.5)
Chloride: 101 mmol/L (ref 96–112)
Creatinine, Ser: 1.42 mg/dL — ABNORMAL HIGH (ref 0.50–1.10)
GFR calc non Af Amer: 40 mL/min — ABNORMAL LOW (ref >90)
GFR, EST AFRICAN AMERICAN: 47 mL/min — AB (ref >90)
GLUCOSE: 225 mg/dL — AB (ref 70–99)
Potassium: 4.2 mmol/L (ref 3.5–5.1)
Sodium: 139 mmol/L (ref 135–145)

## 2014-09-30 LAB — GLUCOSE, CAPILLARY
GLUCOSE-CAPILLARY: 106 mg/dL — AB (ref 70–99)
Glucose-Capillary: 156 mg/dL — ABNORMAL HIGH (ref 70–99)
Glucose-Capillary: 117 mg/dL — ABNORMAL HIGH (ref 70–99)

## 2014-09-30 MED ORDER — POTASSIUM CHLORIDE CRYS ER 20 MEQ PO TBCR
20.0000 meq | EXTENDED_RELEASE_TABLET | Freq: Two times a day (BID) | ORAL | Status: DC
  Administered 2014-09-30: 20 meq via ORAL
  Filled 2014-09-30: qty 1

## 2014-09-30 MED ORDER — FUROSEMIDE 40 MG PO TABS: 40.0000 mg | ORAL_TABLET | Freq: Two times a day (BID) | ORAL | Status: AC

## 2014-09-30 MED ORDER — POTASSIUM CHLORIDE CRYS ER 20 MEQ PO TBCR: 20.0000 meq | EXTENDED_RELEASE_TABLET | Freq: Every day | ORAL | Status: AC

## 2014-09-30 MED ORDER — FUROSEMIDE 40 MG PO TABS
40.0000 mg | ORAL_TABLET | Freq: Two times a day (BID) | ORAL | Status: DC
  Administered 2014-09-30: 40 mg via ORAL
  Filled 2014-09-30 (×2): qty 1

## 2014-09-30 MED ORDER — INSULIN GLARGINE 100 UNITS/ML SOLOSTAR PEN: 40.0000 [IU] | PEN_INJECTOR | Freq: Every day | SUBCUTANEOUS | Status: AC

## 2014-09-30 NOTE — Plan of Care (Signed)
Problem: Phase II Progression Outcomes Goal: O2 sats > equal to 90% on RA or at baseline Outcome: Not Met (add Reason) Patient is oxygen dependent.

## 2014-09-30 NOTE — Plan of Care (Signed)
Problem: Discharge Progression Outcomes Goal: Flu vaccine received if indicated Outcome: Not Met (add Reason) Patient is being discharged today.

## 2014-09-30 NOTE — Plan of Care (Signed)
Problem: Discharge Progression Outcomes Goal: Home O2 if indicated Outcome: Completed/Met Date Met:  09/30/14 Patient already has home O2.

## 2014-09-30 NOTE — Progress Notes (Signed)
SATURATION QUALIFICATIONS: (This note is used to comply with regulatory documentation for home oxygen)  Patient Saturations on 3 Liters at Rest = 94%  Patient Saturations on Room Air while Ambulating = 85%  Patient Saturations on 3 Liters of oxygen while Ambulating = 92%  Patient Saturations on 2 Liters of oxygen while Ambulating = 89%  Please briefly explain why patient needs home oxygen: Patient desaturates without supplemental oxygen  Molly Crawford, PT DPT  [phone]

## 2014-09-30 NOTE — Plan of Care (Signed)
Problem: Discharge Progression Outcomes Goal: Pneumonia vaccine received if indicated Outcome: Not Met (add Reason) Patient is being discharged today.

## 2014-09-30 NOTE — Plan of Care (Signed)
Problem: Phase II Progression Outcomes Goal: Discharge plan remains appropriate-arrangements made Outcome: Adequate for Discharge Patient is refusing home health care and this time.

## 2014-09-30 NOTE — Care Management Note (Addendum)
    Page 1 of 2   10/01/2014     1:54:48 PM CARE MANAGEMENT NOTE 10/01/2014  Patient:  Molly Crawford, Molly Crawford   Account Number:  [id number]  Date Initiated:  09/27/2014  Documentation initiated by:  Duwan Adrian  Subjective/Objective Assessment:   Pt adm on 09/24/14 with respiratory failure.  Pt resides with nephew currently; moved here recently from [place].     Action/Plan:   PT/OT recommending HH follow up at dc, though pt states she is likely to return to [place] soon.   Anticipated DC Date:  09/30/2014   Anticipated DC Plan:  [place]  CM consult      [facility]   Choice offered to / List presented to:  C-1 Patient        [place] arranged  HH-1 RN  [place].   Status of service:  Completed, signed off Medicare Important Message given?  YES (If response is "NO", the following Medicare IM given date fields will be blank) Date Medicare IM given:  09/27/2014 Medicare IM given by:  Rafay Dahan Date Additional Medicare IM given:  09/30/2014 Additional Medicare IM given by:  Ellan Lambert  Discharge Disposition:  [place]  Per UR Regulation:  Reviewed for med. necessity/level of care/duration of stay  If discussed at [place] of Stay Meetings, dates discussed:    Comments:  10/01/14 Ellan Lambert, RN, BSN [phone] Received call from pt's niece, Molly Crawford, that pt is now requesting that [facility] services be set up.  Explained to niece that pt had refused [facility] services twice, but niece states that now pt planning to stay in [place], and would like services set up.  Pt had no preference for [facility] services; referral to [facility] for [facility] needs.  Start of care 24-48h post dc date. Niece requested help with choosing PCP...gave her [facility] Referral [facility] phone #. [phone].  Niece appreciative of help.  09/30/14 Ellan Lambert, RN, BSN [phone] Met with pt to discuss [facility] set up, as ordered by  MD..Marland KitchenPt states she is going back to [place], as things are not working out here in [place] with her nephew.  I encouraged her to allow [place] to come out and see her until she can get back (met with her twice), but she adamantly refused.

## 2014-09-30 NOTE — Progress Notes (Signed)
Attending MD paged and notified.

## 2014-09-30 NOTE — Discharge Summary (Signed)
Physician Discharge Summary  Patient ID: Molly Crawford MRN: [id number] DOB/AGE: October 10, 1955 59 y.o.  Admit date: 09/24/2014 Discharge date: 09/30/2014    Discharge Diagnoses:  Acute on chronic hypercapnic respiratory failure 2nd to asthma exacerbation - on 3L chronic O2 ? COPD Hx of OSA / ? OHS - on nocturnal CPAP Asthma exacerbation Former smoker HTN Elevated troponin - demand ischemia CKD II - III Volume Overload Microcytic anemia DM Acute encephalopathy - resolved Headache                                                                      DISCHARGE PLAN BY DIAGNOSIS    Acute on chronic hypercapnic respiratory failure 2nd to asthma exacerbation - on 3L chronic O2 ? COPD Hx of OSA / ? OHS - on nocturnal CPAP Asthma exacerbation Former smoker Plan: Continue supplemental O2 to maintain SpO2 > 92%. Continue outpatient Symbicort, Spiriva. Will need formal outpatient PFT's. Continue nocturnal CPAP. Home health PT / OT. Prior to returning home to [place], please follow up with [facility] on Monday 10/04/13 at 9:45 AM.  HTN - note, BP remained stable despite only restarting pt's outpatient Toprol.  Additional antihypertensives were held throughout her admission. Elevated troponin - demand ischemia Plan: Continue outpatient Toprol XL 50 mg.  Hold outpatient Nifedipine 80m, Losartan 536m Verapamil 12065mor now. [place] further decisions on outpatient antihypertensive regimen.  CKD II - III Volume Overload Plan: Start Lasix 54m23mD. Potassium supplementation 20 mEq PO QD.  Microcytic anemia Plan: Follow up with PCP.  DM Plan: Continue outpatient Novolog 15u SQ TID with meals. Continue outpatient Lantus, though reduce dose from 60u SQ before bed to 40u SQ before bed (during hospitalization was only on 20u which was later increased to 40u). Follow up with PCP.  Acute encephalopathy - resolved Headache Plan: Resume outpatient PRN  Tylenol.                DISCHARGE SUMMARY   Molly Crawford 58 y57. y/o female with a PMH of Chronic respiratory failure on 3L O2 chronically, Asthma, CHF, DM [place] [facility] with SOB x 20 minutes.  On EMS arrival, pt was unresponsive to stimuli and was subsequently intubated in ED for respiratory failure and airway protection.  She was admitted to the ICU and treated with BD's, steroids, and abx.  She was able to be liberated from the ventilator on 01/30 and was transferred out of the ICU on 1/31 and transferred to a telemetry bed. She was evaluated by PT with recommendations for home health PT / OT.  Pt deferred offer to ambulate with PT due to nausea and SOB, only able to stand at side of bed. For a summary of diagnostic studies and events, see below.  On 2/4, she was deemed medically stable and cleared for discharge home.  A follow up appointment was made with [facility] on Monday morning Feb 8th and pt was encouraged to go to her appointment prior to returning home to [place]     SIGNIFICANT DIAGNOSTIC STUDIES CXR 1/29 >>> No acute process CT  head 1/29 >>> No acute intracranial proces CXR 1/30 >>> Mild bilateral atelectasis noted; lungs otherwise clear CXR 1/31 >>> Stable mild bibasilar subsegmental atelectasis  SIGNIFICANT EVENTS 1/29 - admitted 1/30 - extubated  1/31 - transferred to tele 2/31- seen by PT, recommending home health PT  MICRO DATA  RVP 1/30 >>> neg Sputum 1/30 >>> neg Urine 1/30 >>> neg Blood 1/30 >>> neg MRSA PCR 1/30 >> POS  ANTIBIOTICS Levaquin 1/31 >>> 2/4  CONSULTS None  TUBES / LINES OETT 1/29 >>> 1/30 OGT 1/29 >>> 1/30   Discharge Exam: General: WDWN female, resting in bed, in NAD. Neuro: A&O x 3, non-focal.  HEENT: [place]/AT. PERRL, sclerae anicteric. Cardiovascular: RRR, no M/R/G.  Lungs: Respirations shallow and unlabored.  BS distant, CTA  bilaterally. Abdomen: BS x 4, soft, NT/ND.  Musculoskeletal: No gross deformities, 1+ LE edema.  Skin: Intact, warm, no rashes.   Filed Vitals:   09/29/14 1526 09/29/14 2004 09/30/14 0530 09/30/14 0846  BP:  157/87 160/87 131/70  Pulse:  83 82 93  Temp:  98.2 F (36.8 C) 98 F (36.7 C)   TempSrc:  Oral Oral   Resp:  18 18   Height:      Weight:   95.6 kg (210 lb 12.2 oz)   SpO2: 98% 98% 98% 98%    Discharge Labs  BMET  Recent Labs Lab 09/25/14 0220 09/26/14 0850 09/27/14 0338 09/28/14 0340 09/29/14 0540 09/30/14 0405  NA 140 141 137 138 137 139  K 3.3* 3.3* 4.0 4.2 5.0 4.2  CL 98 106 100 101 101 101  CO2 37* 27 31 33* 29 29  GLUCOSE 390* 149* 318* 195* 179* 225*  BUN 9 9 15 22  29* 32*  CREATININE 1.08 0.95 1.20* 1.43* 1.41* 1.42*  CALCIUM 8.0* 8.0* 8.5 8.8 8.9 8.8  MG 1.9 1.6  --  2.2  --   --   PHOS 3.8 2.7  --  3.4  --   --     CBC  Recent Labs Lab 09/27/14 0338 09/28/14 0340 09/29/14 0540  HGB 10.7* 10.4* 11.4*  HCT 35.4* 32.9* 35.2*  WBC 13.5* 11.1* 10.9*  PLT 256 266 304    Anti-Coagulation  Recent Labs Lab 09/25/14 0220  INR 0.93    Discharge Instructions    Call MD for:  difficulty breathing, headache or visual disturbances    Complete by:  As directed      Call MD for:  extreme fatigue    Complete by:  As directed      Call MD for:  persistant nausea and vomiting    Complete by:  As directed      Call MD for:  temperature >100.4    Complete by:  As directed      Diet - low sodium heart healthy    Complete by:  As directed      Discharge instructions    Complete by:  As directed   Please follow up with [facility] prior to returning home to [place].  Your appointment time and date is listed on your discharge summary.     Increase activity slowly    Complete by:  As directed                Follow-up Information    Follow up with PARRETT,TAMMY, NP On 09/30/2014.   Specialty:  Nurse Practitioner   Why:  Your  appointment is at 09:45 AM.   Contact information:   [address] [address] [phone]         Medication List    STOP taking these medications        azithromycin 250 MG tablet  Commonly known as:  ZITHROMAX     losartan 50 MG tablet  Commonly known as:  COZAAR     NIFEdipine 30 MG 24 hr tablet  Commonly known as:  PROCARDIA-XL/ADALAT-CC/NIFEDICAL-XL     verapamil 120 MG 24 hr capsule  Commonly known as:  VERELAN PM      TAKE these medications        acetaminophen 500 MG tablet  Commonly known as:  TYLENOL  Take 500 mg by mouth every 6 (six) hours as needed for headache.     budesonide-formoterol 160-4.5 MCG/ACT inhaler  Commonly known as:  SYMBICORT  Inhale 2 puffs into the lungs 2 (two) times daily.     furosemide 40 MG tablet  Commonly known as:  LASIX  Take 1 tablet (40 mg total) by mouth 2 (two) times daily.     insulin aspart 100 UNIT/ML FlexPen  Commonly known as:  NOVOLOG  Inject 15 Units into the skin 3 (three) times daily with meals.     insulin glargine 100 unit/mL Sopn  Commonly known as:  LANTUS  Inject 0.4 mLs (40 Units total) into the skin at bedtime.     metoprolol succinate 50 MG 24 hr tablet  Commonly known as:  TOPROL-XL  Take 50 mg by mouth daily. Take with or immediately following a meal.     potassium chloride SA 20 MEQ tablet  Commonly known as:  K-DUR,KLOR-CON  Take 1 tablet (20 mEq total) by mouth daily.  Start taking on:  10/01/2014     tiotropium 18 MCG inhalation capsule  Commonly known as:  SPIRIVA  Place 18 mcg into inhaler and inhale daily.         Disposition: Home.  Discharged Condition: Suzannah Bettes has met maximum benefit of inpatient care and is medically stable and cleared for discharge.  Patient is pending follow up as above.      Time spent on disposition:  Greater than 45 minutes.   Montey Hora, [facility] Pgr: [phone]  or [phone]   Physician Statement:   The Patient was personally examined, the discharge assessment and plan has been personally reviewed and I agree with APP Desai's assessment and plan. > 30 minutes of time have been dedicated to discharge assessment, planning and discharge instructions.   Rigoberto Noel MD

## 2014-09-30 NOTE — Plan of Care (Signed)
Problem: Discharge Progression Outcomes Goal: Independent ADLs or Home Health Care Outcome: Adequate for Discharge Patient is refusing home health services at this time.

## 2014-09-30 NOTE — Progress Notes (Signed)
Physical Therapy Treatment Patient Details Name: Molly Crawford MRN: [id number] DOB: [birth year] Today's Date: 09/30/2014    History of Present Illness Patient is a 59 yo female admitted 09/24/14 with COPD exacerbation, respiratory failure, intubated 09/24/14, extubated 09/25/14.  PMH:  DM, asthma, CHF, COPD on home O2 (3 L)    PT Comments    Patient tolerated activity well this session, OF NOTE: patient does have wet cough during session and reports pain from coughing. Patient Saturations on 3 Liters at Rest = 94%, Patient Saturations on Room Air while Ambulating = 85%, Patient Saturations on 3 Liters of oxygen while Ambulating = 92%,Patient Saturations on 2 Liters of oxygen while Ambulating = 89%.  Patient does report increased WOB and DOR with ambulation.  Will continue to see as indicated and progress as tolerated.    Follow Up Recommendations  Home health PT;Supervision for mobility/OOB     Equipment Recommendations  3in1 (PT)    Recommendations for Other Services       Precautions / Restrictions Precautions Precautions: Fall Restrictions Weight Bearing Restrictions: No    Mobility  Bed Mobility Overal bed mobility: Modified Independent Bed Mobility: Supine to Sit     Supine to sit: Modified independent (Device/Increase time);HOB elevated Sit to supine: Min assist;HOB elevated   General bed mobility comments: received up in chair  Transfers Overall transfer level: Needs assistance Equipment used: Rolling walker (2 wheeled) Transfers: Sit to/from Stand Sit to Stand: Supervision         General transfer comment: VCs for safe hand placement  Ambulation/Gait Ambulation/Gait assistance: Supervision Ambulation Distance (Feet): 210 Feet Assistive device: Rolling walker (2 wheeled)   Gait velocity: significantly decreased 4 standing rest breaks and max encouragement to continue ambulation Gait velocity interpretation: Below normal speed for age/gender General Gait  Details: Patient tolerated ambulation well with RW, performed oxygen saturation assemssent as indicated in impression.    Stairs            Wheelchair Mobility    Modified Rankin (Stroke Patients Only)       Balance     Sitting balance-Leahy Scale: Good       Standing balance-Leahy Scale: Fair                      Cognition Arousal/Alertness: Awake/alert Behavior During Therapy: WFL for tasks assessed/performed Overall Cognitive Status: Within Functional Limits for tasks assessed                      Exercises      General Comments        Pertinent Vitals/Pain Pain Assessment: 0-10 Pain Score: 6  Pain Location: left flank and back Pain Descriptors / Indicators: Aching Pain Intervention(s): Monitored during session;Repositioned;Relaxation    Home Living                      Prior Function            PT Goals (current goals can now be found in the care plan section) Acute Rehab PT Goals Patient Stated Goal: to go home sooner than later PT Goal Formulation: With patient/family Time For Goal Achievement: 10/03/14 Potential to Achieve Goals: Good Progress towards PT goals: Progressing toward goals    Frequency  Min 3X/week    PT Plan Current plan remains appropriate    Co-evaluation             End of Session Equipment Utilized During  Treatment: Oxygen (3 liters continuous) Activity Tolerance: Patient limited by fatigue;Other (comment) Patient left: in bed;with call bell/phone within reach     Time: [id number] PT Time Calculation (min) (ACUTE ONLY): 20 min  Charges:  $Gait Training: 8-22 mins                    G CodesFabio Asa:      Maud Rubendall J 09/30/2014, 2:33 PM Charlotte Crumbevon Sherle Mello, PT DPT  [phone]

## 2014-10-01 LAB — CULTURE, BLOOD (ROUTINE X 2)
Culture: NO GROWTH
Culture: NO GROWTH

## 2014-10-02 ENCOUNTER — Emergency Department (HOSPITAL_COMMUNITY)
Admission: EM | Admit: 2014-10-02 | Discharge: 2014-10-02 | Disposition: A | Discharge status: Home / Self Care | Payer: Medicare Other | Attending: Emergency Medicine | Admitting: Emergency Medicine

## 2014-10-02 ENCOUNTER — Encounter (HOSPITAL_COMMUNITY): Payer: Self-pay | Admitting: *Deleted

## 2014-10-02 DIAGNOSIS — I509 Heart failure, unspecified: Secondary | ICD-10-CM | POA: Diagnosis not present

## 2014-10-02 DIAGNOSIS — J45909 Unspecified asthma, uncomplicated: Secondary | ICD-10-CM | POA: Diagnosis not present

## 2014-10-02 DIAGNOSIS — I10 Essential (primary) hypertension: Secondary | ICD-10-CM | POA: Insufficient documentation

## 2014-10-02 DIAGNOSIS — Z794 Long term (current) use of insulin: Secondary | ICD-10-CM | POA: Diagnosis not present

## 2014-10-02 DIAGNOSIS — E119 Type 2 diabetes mellitus without complications: Secondary | ICD-10-CM | POA: Insufficient documentation

## 2014-10-02 DIAGNOSIS — R51 Headache: Secondary | ICD-10-CM | POA: Diagnosis not present

## 2014-10-02 DIAGNOSIS — Z7951 Long term (current) use of inhaled steroids: Secondary | ICD-10-CM | POA: Diagnosis not present

## 2014-10-02 DIAGNOSIS — Z79899 Other long term (current) drug therapy: Secondary | ICD-10-CM | POA: Insufficient documentation

## 2014-10-02 MED ORDER — OXYCODONE-ACETAMINOPHEN 5-325 MG PO TABS
1.0000 | ORAL_TABLET | Freq: Once | ORAL | Status: AC
  Administered 2014-10-02: 1 via ORAL
  Filled 2014-10-02: qty 1

## 2014-10-02 NOTE — Discharge Instructions (Signed)
How to Take Your Blood Pressure °HOW DO I GET A BLOOD PRESSURE MACHINE? °· You can buy an electronic home blood pressure machine at your local pharmacy. Insurance will sometimes cover the cost if you have a prescription. °· Ask your doctor what type of machine is best for you. There are different machines for your arm and your wrist. °· If you decide to buy a machine to check your blood pressure on your arm, first check the size of your arm so you can buy the right size cuff. To check the size of your arm:   °· Use a measuring tape that shows both inches and centimeters.   °· Wrap the measuring tape around the upper-middle part of your arm. You may need someone to help you measure.   °· Write down your arm measurement in both inches and centimeters.   °· To measure your blood pressure correctly, it is important to have the right size cuff.   °· If your arm is up to 13 inches (up to 34 centimeters), get an adult cuff size. °· If your arm is 13 to 17 inches (35 to 44 centimeters), get a large adult cuff size.   °·  If your arm is 17 to 20 inches (45 to 52 centimeters), get an adult thigh cuff.   °WHAT DO THE NUMBERS MEAN?  °· There are two numbers that make up your blood pressure. For example: 120/80. °· The first number (120 in our example) is called the "systolic pressure." It is a measure of the pressure in your blood vessels when your heart is pumping blood. °· The second number (80 in our example) is called the "diastolic pressure." It is a measure of the pressure in your blood vessels when your heart is resting between beats. °· Your doctor will tell you what your blood pressure should be. °WHAT SHOULD I DO BEFORE I CHECK MY BLOOD PRESSURE?  °· Try to rest or relax for at least 30 minutes before you check your blood pressure. °· Do not smoke. °· Do not have any drinks with caffeine, such as: °· Soda. °· Coffee. °· Tea. °· Check your blood pressure in a quiet room. °· Sit down and stretch out your arm on a table.  Keep your arm at about the level of your heart. Let your arm relax. °· Make sure that your legs are not crossed. °HOW DO I CHECK MY BLOOD PRESSURE? °· Follow the directions that came with your machine. °· Make sure you remove any tight-fighting clothing from your arm or wrist. Wrap the cuff around your upper arm or wrist. You should be able to fit a finger between the cuff and your arm. If you cannot fit a finger between the cuff and your arm, it is too tight and should be removed and rewrapped. °· Some units require you to manually pump up the arm cuff. °· Automatic units inflate the cuff when you press a button. °· Cuff deflation is automatic in both models. °· After the cuff is inflated, the unit measures your blood pressure and pulse. The readings are shown on a monitor. Hold still and breathe normally while the cuff is inflated. °· Getting a reading takes less than a minute. °· Some models store readings in a memory. Some provide a printout of readings. If your machine does not store your readings, keep a written record. °· Take readings with you to your next visit with your doctor. °Document Released: 07/26/2008 Document Revised: 12/28/2013 Document Reviewed: 10/08/2013 °ExitCare® Patient Information ©  2015 [employer]. This information is not intended to replace advice given to you by your health care provider. Make sure you discuss any questions you have with your health care provider. ° °Hypertension °Hypertension, commonly called high blood pressure, is when the force of blood pumping through your arteries is too strong. Your arteries are the blood vessels that carry blood from your heart throughout your body. A blood pressure reading consists of a higher number over a lower number, such as 110/72. The higher number (systolic) is the pressure inside your arteries when your heart pumps. The lower number (diastolic) is the pressure inside your arteries when your heart relaxes. Ideally you want your blood  pressure below 120/80. °Hypertension forces your heart to work harder to pump blood. Your arteries may become narrow or stiff. Having hypertension puts you at risk for heart disease, stroke, and other problems.  °RISK FACTORS °Some risk factors for high blood pressure are controllable. Others are not.  °Risk factors you cannot control include:  °· Race. You may be at higher risk if you are African American. °· Age. Risk increases with age. °· Gender. Men are at higher risk than women before age 45 years. After age 65, women are at higher risk than men. °Risk factors you can control include: °· Not getting enough exercise or physical activity. °· Being overweight. °· Getting too much fat, sugar, calories, or salt in your diet. °· Drinking too much alcohol. °SIGNS AND SYMPTOMS °Hypertension does not usually cause signs or symptoms. Extremely high blood pressure (hypertensive crisis) may cause headache, anxiety, shortness of breath, and nosebleed. °DIAGNOSIS  °To check if you have hypertension, your health care provider will measure your blood pressure while you are seated, with your arm held at the level of your heart. It should be measured at least twice using the same arm. Certain conditions can cause a difference in blood pressure between your right and left arms. A blood pressure reading that is higher than normal on one occasion does not mean that you need treatment. If one blood pressure reading is high, ask your health care provider about having it checked again. °TREATMENT  °Treating high blood pressure includes making lifestyle changes and possibly taking medicine. Living a healthy lifestyle can help lower high blood pressure. You may need to change some of your habits. °Lifestyle changes may include: °· Following the DASH diet. This diet is high in fruits, vegetables, and whole grains. It is low in salt, red meat, and added sugars. °· Getting at least 2½ hours of brisk physical activity every week. °· Losing  weight if necessary. °· Not smoking. °· Limiting alcoholic beverages. °· Learning ways to reduce stress. ° If lifestyle changes are not enough to get your blood pressure under control, your health care provider may prescribe medicine. You may need to take more than one. Work closely with your health care provider to understand the risks and benefits. °HOME CARE INSTRUCTIONS °· Have your blood pressure rechecked as directed by your health care provider.   °· Take medicines only as directed by your health care provider. Follow the directions carefully. Blood pressure medicines must be taken as prescribed. The medicine does not work as well when you skip doses. Skipping doses also puts you at risk for problems.   °· Do not smoke.   °· Monitor your blood pressure at home as directed by your health care provider.  °SEEK MEDICAL CARE IF:  °· You think you are having a reaction to medicines taken. °·   You have recurrent headaches or feel dizzy. °· You have swelling in your ankles. °· You have trouble with your vision. °SEEK IMMEDIATE MEDICAL CARE IF: °· You develop a severe headache or confusion. °· You have unusual weakness, numbness, or feel faint. °· You have severe chest or abdominal pain. °· You vomit repeatedly. °· You have trouble breathing. °MAKE SURE YOU:  °· Understand these instructions. °· Will watch your condition. °· Will get help right away if you are not doing well or get worse. °Document Released: 08/13/2005 Document Revised: 12/28/2013 Document Reviewed: 06/05/2013 °ExitCare® Patient Information ©2015 [employer]. This information is not intended to replace advice given to you by your health care provider. Make sure you discuss any questions you have with your health care provider. ° °

## 2014-10-02 NOTE — ED Provider Notes (Signed)
CSN: [id number]     Arrival date & time 10/02/14  1321 History   First MD Initiated Contact with Patient 10/02/14 1330     Chief Complaint  Patient presents with  . Hypertension     (Consider location/radiation/quality/duration/timing/severity/associated sxs/prior Treatment) Patient is a 59 y.o. female presenting with hypertension. The history is provided by the patient.  Hypertension This is a chronic problem. The current episode started 3 to 5 hours ago. The problem occurs constantly. The problem has been resolved. Associated symptoms include headaches. Pertinent negatives include no chest pain, no abdominal pain and no shortness of breath. Nothing aggravates the symptoms. Nothing relieves the symptoms. Treatments tried: home bp meds. The treatment provided significant relief.    Past Medical History  Diagnosis Date  . Asthma   . CHF (congestive heart failure)   . Diabetes mellitus without complication    History reviewed. No pertinent past surgical history. History reviewed. No pertinent family history. History  Substance Use Topics  . Smoking status: Never Smoker   . Smokeless tobacco: Not on file  . Alcohol Use: Not on file   OB History    No data available     Review of Systems  Constitutional: Negative for fever and fatigue.  HENT: Negative for congestion and drooling.   Eyes: Negative for pain.  Respiratory: Negative for cough and shortness of breath.   Cardiovascular: Negative for chest pain.  Gastrointestinal: Negative for nausea, vomiting, abdominal pain and diarrhea.  Genitourinary: Negative for dysuria and hematuria.  Musculoskeletal: Negative for back pain, gait problem and neck pain.  Skin: Negative for color change.  Neurological: Positive for headaches. Negative for dizziness.  Hematological: Negative for adenopathy.  Psychiatric/Behavioral: Negative for behavioral problems.  All other systems reviewed and are negative.     Allergies   Corticosteroids  Home Medications   Prior to Admission medications   Medication Sig Start Date End Date Taking? Authorizing Provider  acetaminophen (TYLENOL) 500 MG tablet Take 500 mg by mouth every 6 (six) hours as needed for headache.   Yes Historical Provider, MD  budesonide-formoterol (SYMBICORT) 160-4.5 MCG/ACT inhaler Inhale 2 puffs into the lungs 2 (two) times daily.   Yes Historical Provider, MD  furosemide (LASIX) 40 MG tablet Take 1 tablet (40 mg total) by mouth 2 (two) times daily. 09/30/14  Yes Rahul P Desai, PA-C  insulin aspart (NOVOLOG) 100 UNIT/ML FlexPen Inject 15 Units into the skin 3 (three) times daily with meals.   Yes Historical Provider, MD  insulin glargine (LANTUS) 100 unit/mL SOPN Inject 0.4 mLs (40 Units total) into the skin at bedtime. 09/30/14  Yes Rahul P Desai, PA-C  metoprolol succinate (TOPROL-XL) 50 MG 24 hr tablet Take 50 mg by mouth daily. Take with or immediately following a meal.   Yes Historical Provider, MD  potassium chloride SA (K-DUR,KLOR-CON) 20 MEQ tablet Take 1 tablet (20 mEq total) by mouth daily. 10/01/14  Yes Rahul P Desai, PA-C  predniSONE (DELTASONE) 20 MG tablet Take 40 mg by mouth daily with breakfast.   Yes Historical Provider, MD  tiotropium (SPIRIVA) 18 MCG inhalation capsule Place 18 mcg into inhaler and inhale daily.   Yes Historical Provider, MD   BP 167/89 mmHg  Pulse 99  Temp(Src) 97.6 F (36.4 C) (Oral)  Resp 20  SpO2 100% Physical Exam  Constitutional: She is oriented to person, place, and time. She appears well-developed and well-nourished.  HENT:  Head: Normocephalic and atraumatic.  Mouth/Throat: Oropharynx is clear and moist. No  oropharyngeal exudate.  Eyes: Conjunctivae and EOM are normal. Pupils are equal, round, and reactive to light.  Neck: Normal range of motion. Neck supple.  Cardiovascular: Normal rate, regular rhythm, normal heart sounds and intact distal pulses.  Exam reveals no gallop and no friction rub.   No  murmur heard. Pulmonary/Chest: Effort normal and breath sounds normal. No respiratory distress. She has no wheezes.  Abdominal: Soft. Bowel sounds are normal. There is no tenderness. There is no rebound and no guarding.  Musculoskeletal: Normal range of motion. She exhibits no edema or tenderness.  Neurological: She is alert and oriented to person, place, and time.  alert, oriented x3 speech: normal in context and clarity memory: intact grossly cranial nerves II-XII: intact motor strength: full proximally and distally no involuntary movements or tremors sensation: intact to light touch diffusely  cerebellar: finger-to-nose and heel-to-shin intact gait: normal forwards and backwards  Skin: Skin is warm and dry.  Psychiatric: She has a normal mood and affect. Her behavior is normal.  Nursing note and vitals reviewed.   ED Course  Procedures (including critical care time) Labs Review Labs Reviewed - No data to display  Imaging Review No results found.   EKG Interpretation   Date/Time:  Saturday October 02 2014 14:33:32 EST Ventricular Rate:  88 PR Interval:  150 QRS Duration: 84 QT Interval:  378 QTC Calculation: 457 R Axis:   -6 Text Interpretation:  Sinus rhythm Probable left atrial enlargement Low  voltage, precordial leads No significant change since last tracing  Confirmed by Annell Canty  MD, Esra Frankowski ([id number]) on 10/02/2014 2:39:31 PM      MDM   Final diagnoses:  Essential hypertension    1:53 PM 59 y.o. female w hx of asthma, chf, DM on 2-3L via [place] (baseline since d/c) who presents with hypertension. Of note the patient was recently discharged after being admitted for acute on chronic hypercapnic respiratory failure and requiring intubation. She improved in the hospital and was discharged on her home blood pressure medications. The patient notes that her home nurse today checked her blood pressure over several hours and noted it to be in the 190s over 130s. The patient  states that she had not taken her blood pressure medications yet because she usually takes them with a meal. She did take her blood pressure medications at home w/ dec in her BP after but upon the insistence of her home health nurse she came to the ER. Her blood pressure here is 167/89 which is consistent with her discharge vital signs. She denies any new shortness of breath or chest pain. She denies any vision changes. She states that she has a mild frontal headache which is 6 out of 10 currently. She says this headache is consistent with headaches that she has every few days over the last several weeks. She has a normal neurologic exam. Asx between HA's. We'll get screening EKG but in otherwise asymptomatic hypertension relieved by her normal blood pressure medications I do not think there is any need to work this up further. The patient agrees.  3:22 PM: Pt continues to appear well.  I have discussed the diagnosis/risks/treatment options with the patient and believe the pt to be eligible for discharge home to follow-up with her pcp. We also discussed returning to the ED immediately if new or worsening sx occur. We discussed the sx which are most concerning (e.g., worsening sob, cp, worsening HA, vision changes) that necessitate immediate return. Medications administered to the patient during  their visit and any new prescriptions provided to the patient are listed below.  Medications given during this visit Medications  oxyCODONE-acetaminophen (PERCOCET/ROXICET) 5-325 MG per tablet 1 tablet (1 tablet Oral Given 10/02/14 1438)    New Prescriptions   No medications on file     Purvis Sheffield, MD 10/02/14 1525

## 2014-10-02 NOTE — ED Notes (Addendum)
Patient is visiting family from [place] and was considering moving. Patient was seen in January in the ED for respiratory distress and a home health nurse came out to re-evaluate the patient and found her blood pressure to be high. The patient was arguing with someone in the home and the home health nurse felt that the stress may have elevated blood pressure. [facility] to take her blood pressure medication which she did, but the nurse called EMS anyway. Patient is asymptomatic. Patient did not want to come to the ER and felt her blood pressure would come down and it has, but she was told by the nurse that EMS had to be called.

## 2014-10-02 NOTE — ED Notes (Signed)
Bed: WA08 Expected date: 10/02/14 Expected time: 1:10 PM Means of arrival: Ambulance Comments: HTN

## 2014-10-04 ENCOUNTER — Encounter: Payer: Self-pay | Admitting: Adult Health

## 2014-10-04 ENCOUNTER — Ambulatory Visit (INDEPENDENT_AMBULATORY_CARE_PROVIDER_SITE_OTHER): Payer: Medicare Other | Admitting: Adult Health

## 2014-10-04 VITALS — BP 178/98 | HR 100 | Temp 98.1°F | Ht 63.0 in | Wt 211.4 lb

## 2014-10-04 DIAGNOSIS — J9691 Respiratory failure, unspecified with hypoxia: Secondary | ICD-10-CM

## 2014-10-04 DIAGNOSIS — I10 Essential (primary) hypertension: Secondary | ICD-10-CM | POA: Diagnosis not present

## 2014-10-04 DIAGNOSIS — G4730 Sleep apnea, unspecified: Secondary | ICD-10-CM | POA: Diagnosis not present

## 2014-10-04 DIAGNOSIS — J9692 Respiratory failure, unspecified with hypercapnia: Principal | ICD-10-CM

## 2014-10-04 DIAGNOSIS — J9600 Acute respiratory failure, unspecified whether with hypoxia or hypercapnia: Secondary | ICD-10-CM | POA: Diagnosis not present

## 2014-10-04 DIAGNOSIS — J441 Chronic obstructive pulmonary disease with (acute) exacerbation: Secondary | ICD-10-CM | POA: Diagnosis not present

## 2014-10-04 NOTE — Assessment & Plan Note (Signed)
Continue on nocturnal C Pap 

## 2014-10-04 NOTE — Patient Instructions (Signed)
Continue on Symbicort and Spiriva  Continue on Oxygen 3l/m  Follow up with doctor in [place] .  If you do not move back we will be glad to see you to follow your COPD and Sleep Apnea , call our office for an appointment.  Your blood pressure is elevated, need to discuss with your doctor that your blood pressure is elevated, make sure you are taking your meds .  Low salt diet.

## 2014-10-04 NOTE — Progress Notes (Signed)
Reviewed & agree with plan  

## 2014-10-04 NOTE — Assessment & Plan Note (Signed)
Your blood pressure is elevated, need to discuss with your doctor that your blood pressure is elevated, make sure you are taking your meds .  Low salt diet.

## 2014-10-04 NOTE — Assessment & Plan Note (Signed)
Chronic Resp Failure -advised on O2 usage   plan Continue on Oxygen 3l/m  Follow up with doctor in [place] .  If you do not move back we will be glad to see you to follow your COPD and Sleep Apnea , call our office for an appointment.  Your blood pressure is elevated, need to discuss with your doctor that your blood pressure is elevated, make sure you are taking your meds .  Low salt diet.

## 2014-10-04 NOTE — Progress Notes (Signed)
Subjective:    Patient ID: Molly Crawford, female    DOB: 14-Jan-1956, 59 y.o.   MRN: [id number]  HPI 59 year old female, smoker seen for initial pulmonary consult during hospitalization January 29 to 09/30/2014 for acute on chronic hypercarbic respiratory failure secondary to asthma versus COPD exacerbation   10/04/2014 post hospital follow-up Patient returns for a post hospital follow-up Patient was admitted January 29 to 09/30/2014 for acute on chronic hypercarbic respiratory arrest secondary to asthma versus COPD exacerbation. Patient was on home oxygen at 3 L prior to admission Patient was visiting family here in [place] as she is from [place]. Cultures were negative. She was treated with antibiotics. Patient presented in respiratory distress and was intubated with brief ventilator support. She was treated with nebulized bronchodilators. Along with diuresis with improved respiratory status. Since discharge. Patient is feeling She does have underlying sleep apnea and is on nocturnal C Pap which she says she is compliant with. She denies any hemoptysis, chest pain, orthopnea, PND, or increased leg swelling. She is supposed to be on oxygen however did not wear to the office this morning. Says she understood that she was not to wear this as much per nursing staff, advised this was not recommended by physician.  She is encouraged on oxygen compliance, and advised on the effects of hypoxia She says she has been on prednisone 20mg  chronically , and has restarted this.  She is deciding when she is returning to [place] and has spoken with her doctor who knows her best . Has restarted on his recommendations.  Says she was intubated on vent in 2013.      Review of Systems Constitutional:   No  weight loss, night sweats,  Fevers, chills,  +fatigue, or  lassitude.  HEENT:   No headaches,  Difficulty swallowing,  Tooth/dental problems, or  Sore throat,                No sneezing, itching, ear ache,  nasal congestion, post nasal drip,   CV:  No chest pain,  Orthopnea, PND,  anasarca, dizziness, palpitations, syncope.   GI  No heartburn, indigestion, abdominal pain, nausea, vomiting, diarrhea, change in bowel habits, loss of appetite, bloody stools.   Resp:   No chest wall deformity  Skin: no rash or lesions.  GU: no dysuria, change in color of urine, no urgency or frequency.  No flank pain, no hematuria   MS:  No joint pain or swelling.  No decreased range of motion.  No back pain.  Psych:  No change in mood or affect. No depression or anxiety.  No memory loss.         Objective:   Physical Exam GEN: A/Ox3; pleasant , NAD, chronically ill appearing , in w/c   HEENT:  [place]/AT,  EACs-clear, TMs-wnl, NOSE-clear, THROAT-clear, no lesions, no postnasal drip or exudate noted.   NECK:  Supple w/ fair ROM; no JVD; normal carotid impulses w/o bruits; no thyromegaly or nodules palpated; no lymphadenopathy.  RESP  Clear  P & A; w/o, wheezes/ rales/ or rhonchi.no accessory muscle use, no dullness to percussion  CARD:  RRR, no m/r/g  , tr peripheral edema, pulses intact, no cyanosis or clubbing.  GI:   Soft & nt; nml bowel sounds; no organomegaly or masses detected.  Musco: Warm bil, no deformities or joint swelling noted.   Neuro: alert, no focal deficits noted.    Skin: Warm, no lesions or rashes  Assessment & Plan:

## 2014-10-04 NOTE — Assessment & Plan Note (Signed)
Recent severe exacerbation, but had a by acute on chronic hypercarbic and hypoxic respiratory failure ending up requiring ventilator support. Patient is much improved. She is from [place] and is determining when her return back to [place] with her physicians are. She is advised if she does not returning. Stays here locally to follow back in our office. She will need PFTs. She has restarted chronic steroids per her previous recommendation by her physician in [place].  Plan  Continue on Symbicort and Spiriva  Continue on Oxygen 3l/m  Follow up with doctor in [place] .  If you do not move back we will be glad to see you to follow your COPD and Sleep Apnea , call our office for an appointment.  Your blood pressure is elevated, need to discuss with your doctor that your blood pressure is elevated, make sure you are taking your meds .  Low salt diet.

## 2015-08-12 IMAGING — CR DG CHEST 1V PORT
1 series · 1 of 1 positions shown · non-contrast
Comparison: None.

CLINICAL DATA: Trauma.  Short of breath.

EXAM:
PORTABLE CHEST - 1 VIEW

[AP]
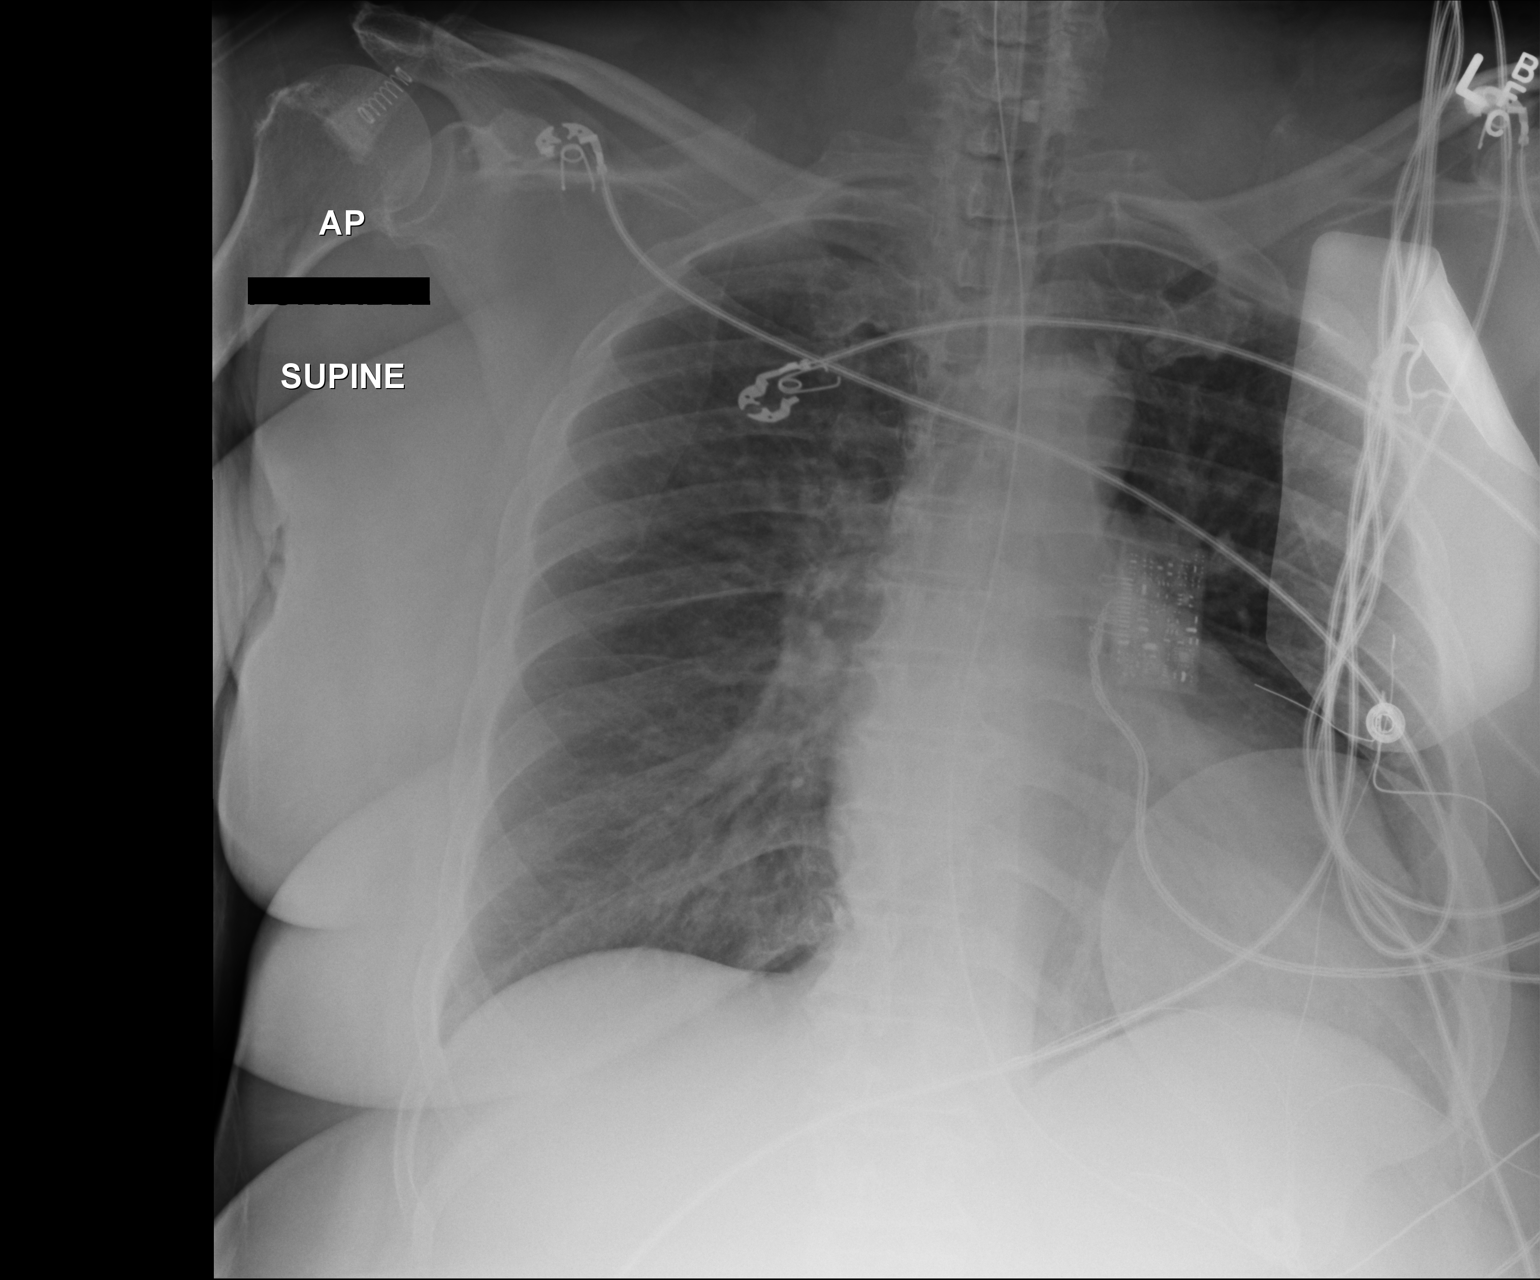

[1 of 1 positions shown; findings below may reference images not displayed]

FINDINGS: Endotracheal tube tip projects 2.6 cm above the carina. Nasogastric
tube passes below the diaphragm into the stomach.

Cardiac silhouette is normal in size and configuration. No
mediastinal or hilar masses. Clear lungs. No gross pneumothorax on
this supine exam.

Bony thorax appears intact.  No fracture is seen.
IMPRESSION: No acute cardiopulmonary disease.

Support apparatus well positioned.

## 2015-08-12 IMAGING — CT CT HEAD W/O CM
2 series · 15 of 30 positions shown, 19 images · non-contrast
Comparison: None.

CLINICAL DATA: Found down at home, shortness of breath for 20 min.
Unresponsive. Acute encephalopathy. History of diabetes and CHF.

EXAM:
CT HEAD WITHOUT CONTRAST
TECHNIQUE: Contiguous axial images were obtained from the base of the skull
through the vertex without intravenous contrast.

[Series 201: head w/o, idose (1) · axial · non-contrast · 0.49mm/px · z∈[+90,+210]mm · 13 of 30 slices shown, 17 images]
[im 3/30  brain]
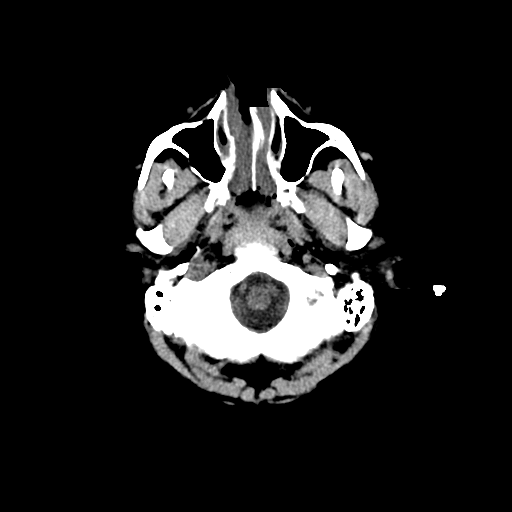
[im 3/30  bone]
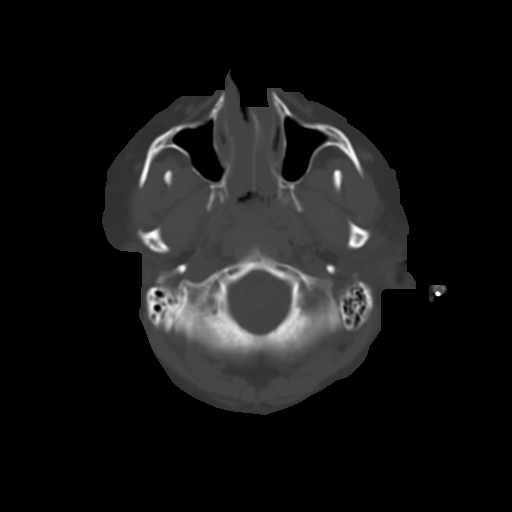
[im 5/30  brain]
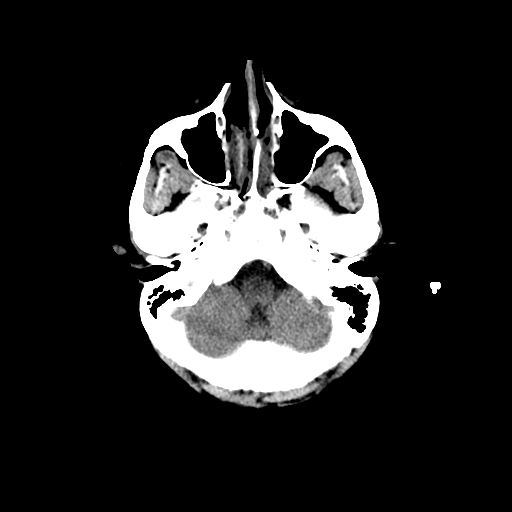
[im 7/30  brain]
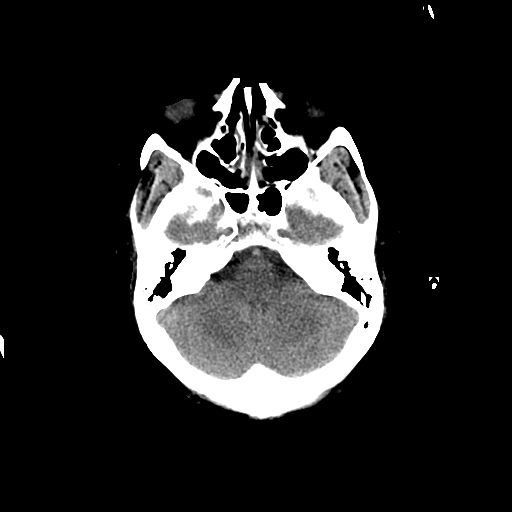
[im 9/30  brain]
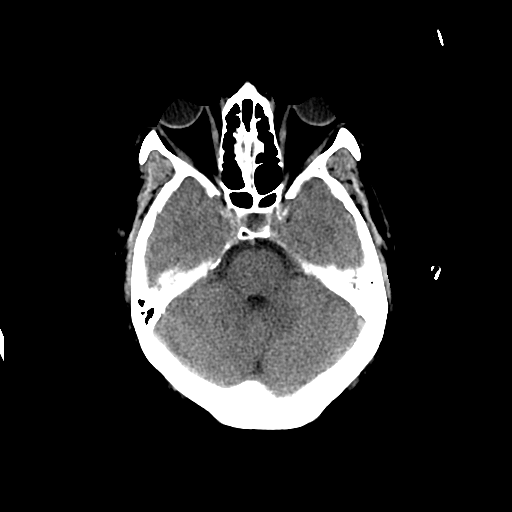
[im 11/30  brain]
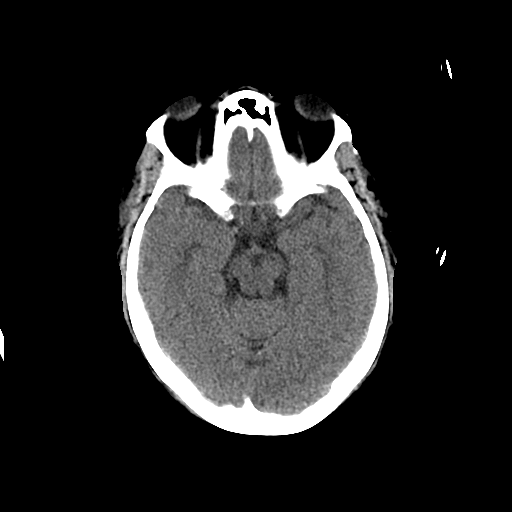
[im 11/30  bone]
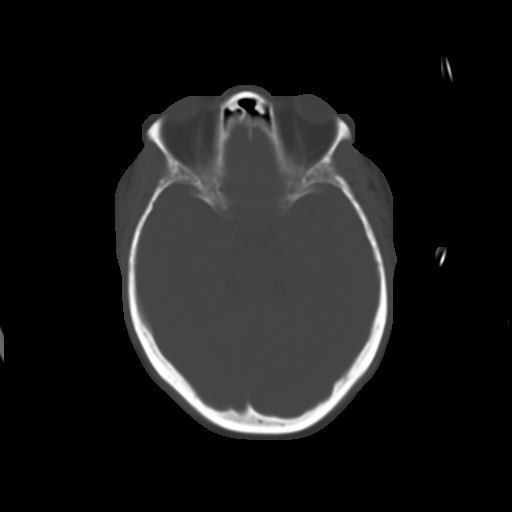
[im 13/30  brain]
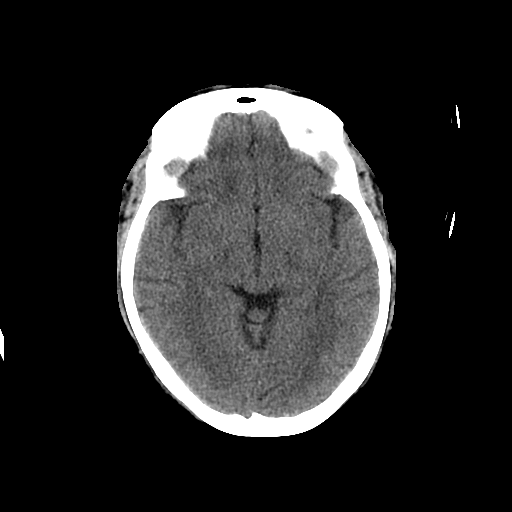
[im 15/30  brain]
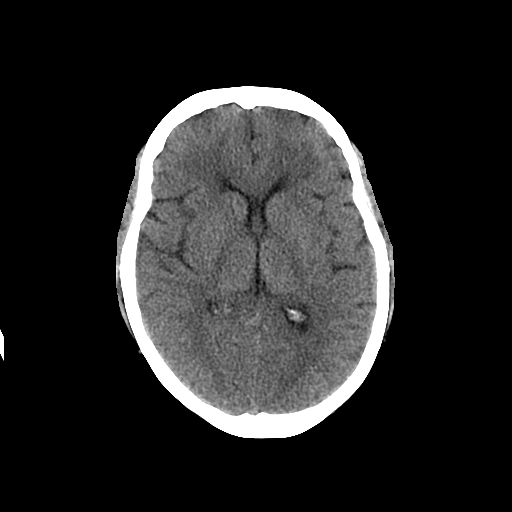
[im 17/30  brain]
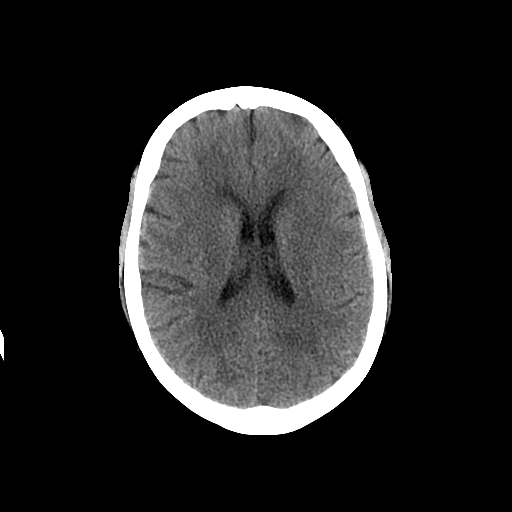
[im 19/30  brain]
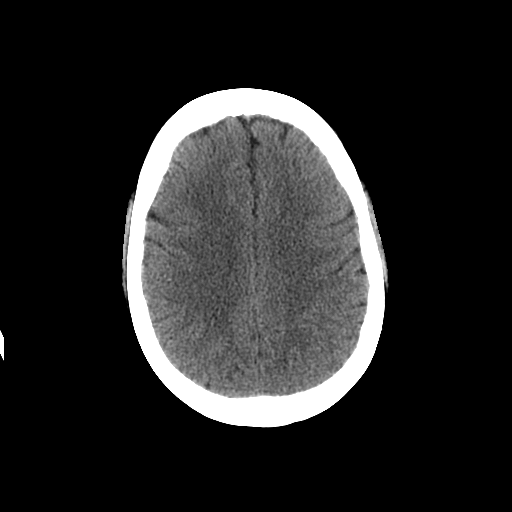
[im 19/30  bone]
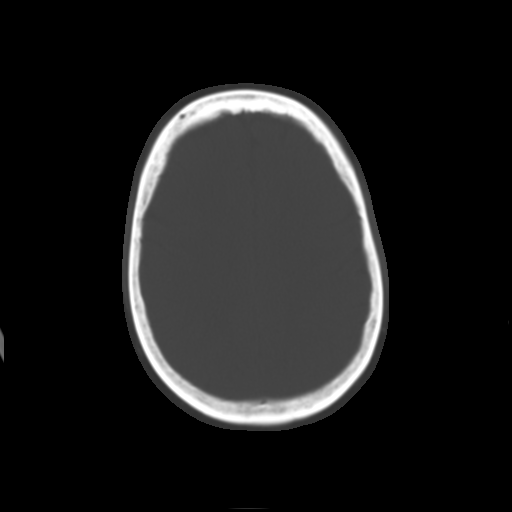
[im 21/30  brain]
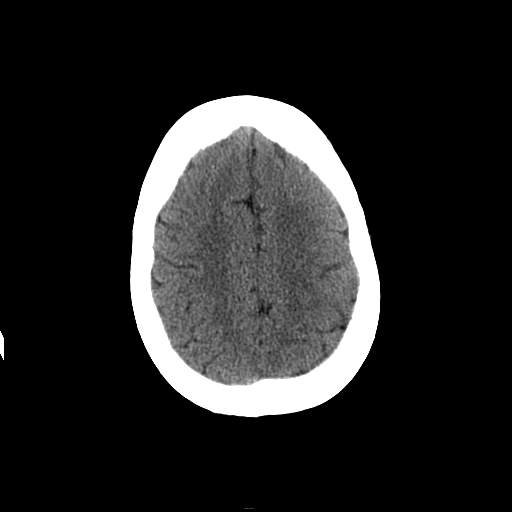
[im 23/30  brain]
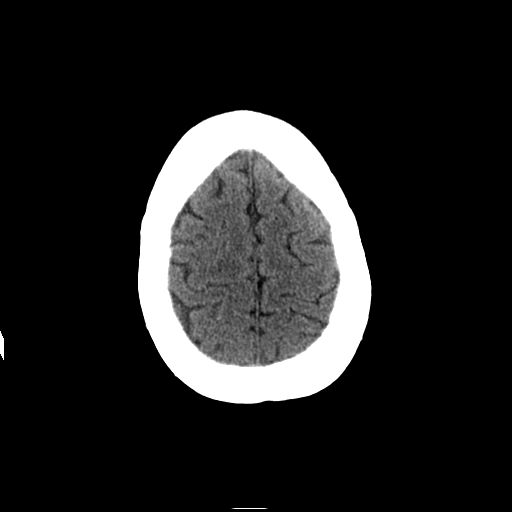
[im 25/30  brain]
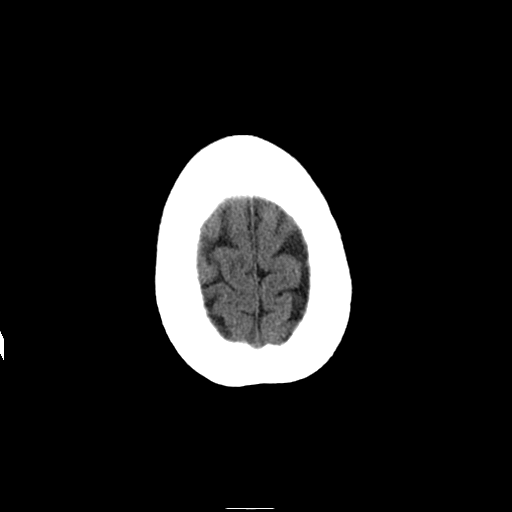
[im 27/30  brain]
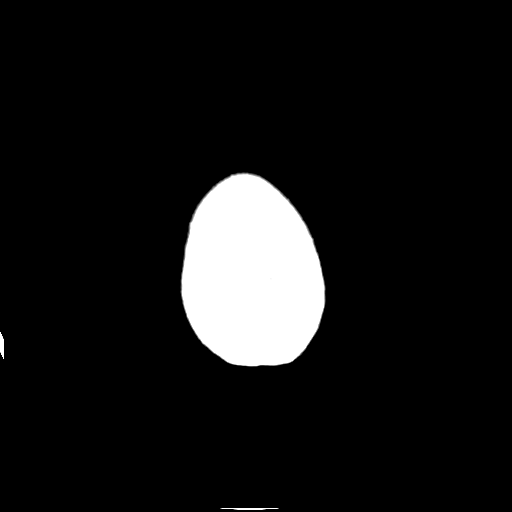
[im 27/30  bone]
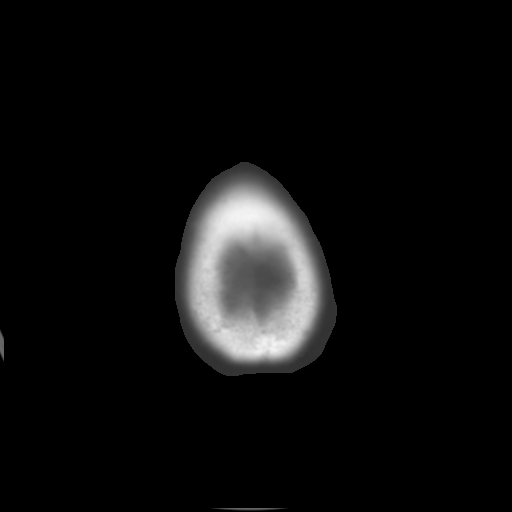

[Series 202: head w/o bone, idose (1) · axial · non-contrast · 0.49mm/px · z∈[+90,+110]mm · 2 of 30 slices shown]
[im 3/30  bone]
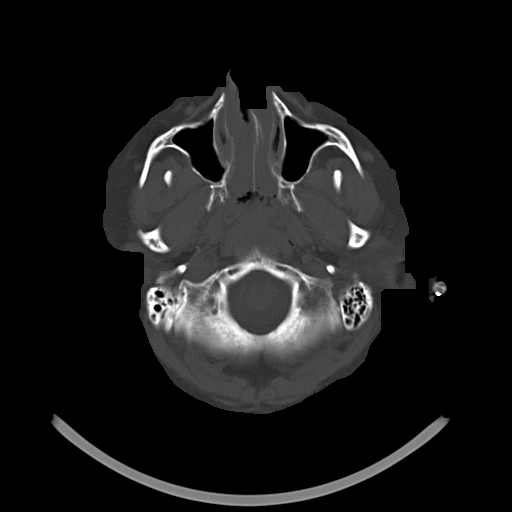
[im 7/30  bone]
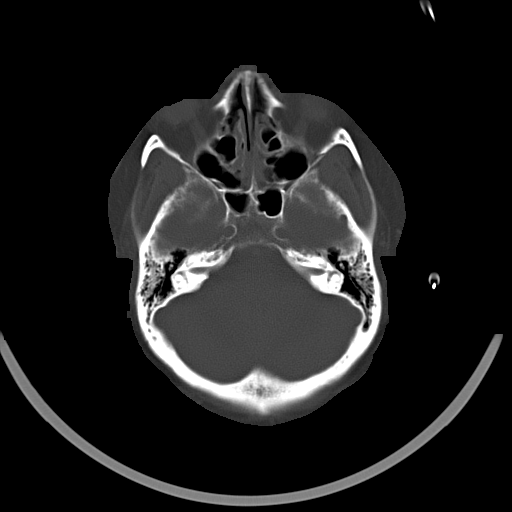

[15 of 30 positions shown; findings below may reference images not displayed]

FINDINGS: The ventricles and sulci are normal for age. No intraparenchymal
hemorrhage, mass effect nor midline shift. Minimal supratentorial
white matter hypodensities are though non-specific suggest sequelae
of chronic small vessel ischemic disease. No acute large vascular
territory infarcts.

No abnormal extra-axial fluid collections. Basal cisterns are
patent.

No skull fracture. The included ocular globes and orbital contents
are non-suspicious. Mild paranasal sinus mucosal thickening without
air-fluid levels. The mastoid air cells are well aerated. Mild
temporomandibular osteoarthrosis.
IMPRESSION: No acute intracranial process.

Minimal white matter changes can be seen with chronic small vessel
ischemic disease.

  By: Lyanne Goebel

## 2015-08-13 IMAGING — CR DG CHEST 1V PORT
1 series · 1 of 1 positions shown · non-contrast
Comparison: Chest radiograph performed 09/24/2014

CLINICAL DATA: Acute onset of respiratory failure. Subsequent
encounter.

EXAM:
PORTABLE CHEST - 1 VIEW

[AP]
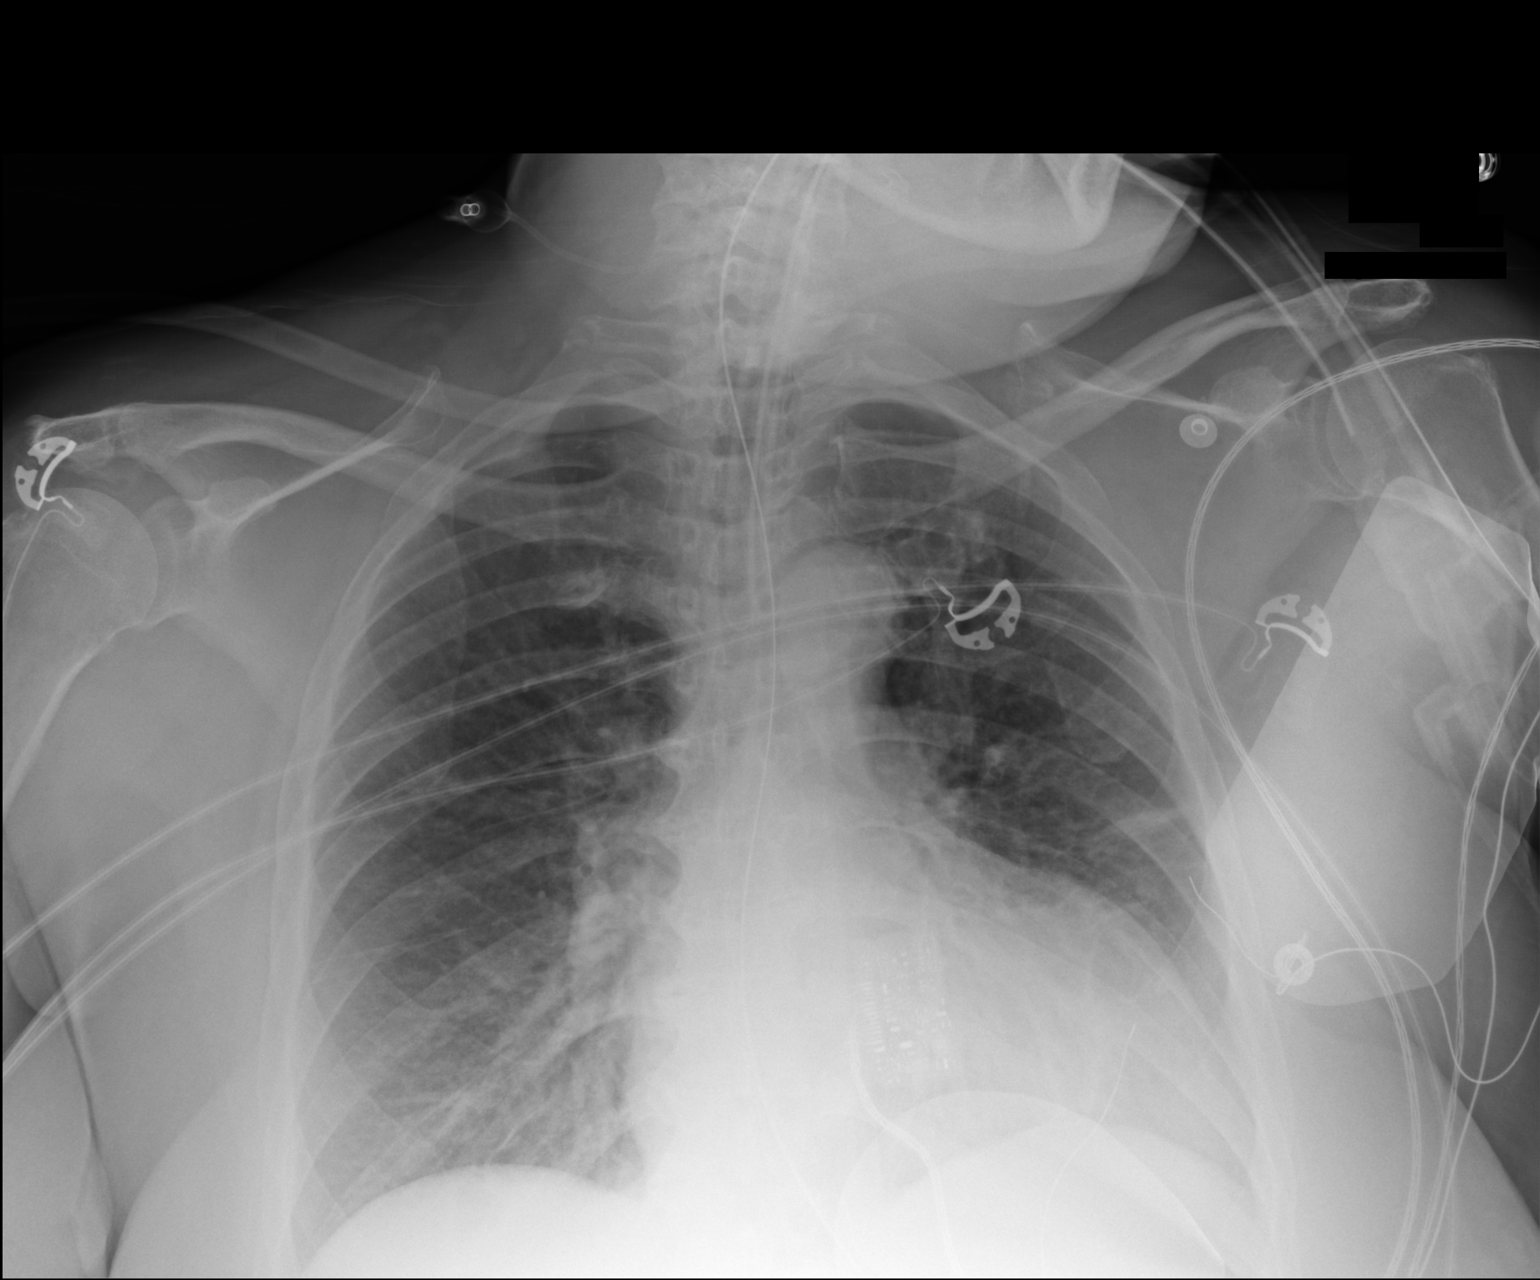

[1 of 1 positions shown; findings below may reference images not displayed]

FINDINGS: The patient's endotracheal tube is seen ending 2-3 cm above the
carina. An enteric tube is noted extending below the diaphragm.

The lungs are relatively well expanded. Mild bilateral atelectasis
is noted. No pleural effusion or pneumothorax is seen.

The cardiomediastinal silhouette is borderline normal in size. No
acute osseous abnormalities are identified. External pacing pads are
noted.
IMPRESSION: Mild bilateral atelectasis noted; lungs otherwise clear.

## 2015-08-14 IMAGING — CR DG CHEST 1V PORT
1 series · 1 of 1 positions shown · non-contrast
Comparison: September 25, 2014.

CLINICAL DATA: Shortness of breath.

EXAM:
PORTABLE CHEST - 1 VIEW

[AP]
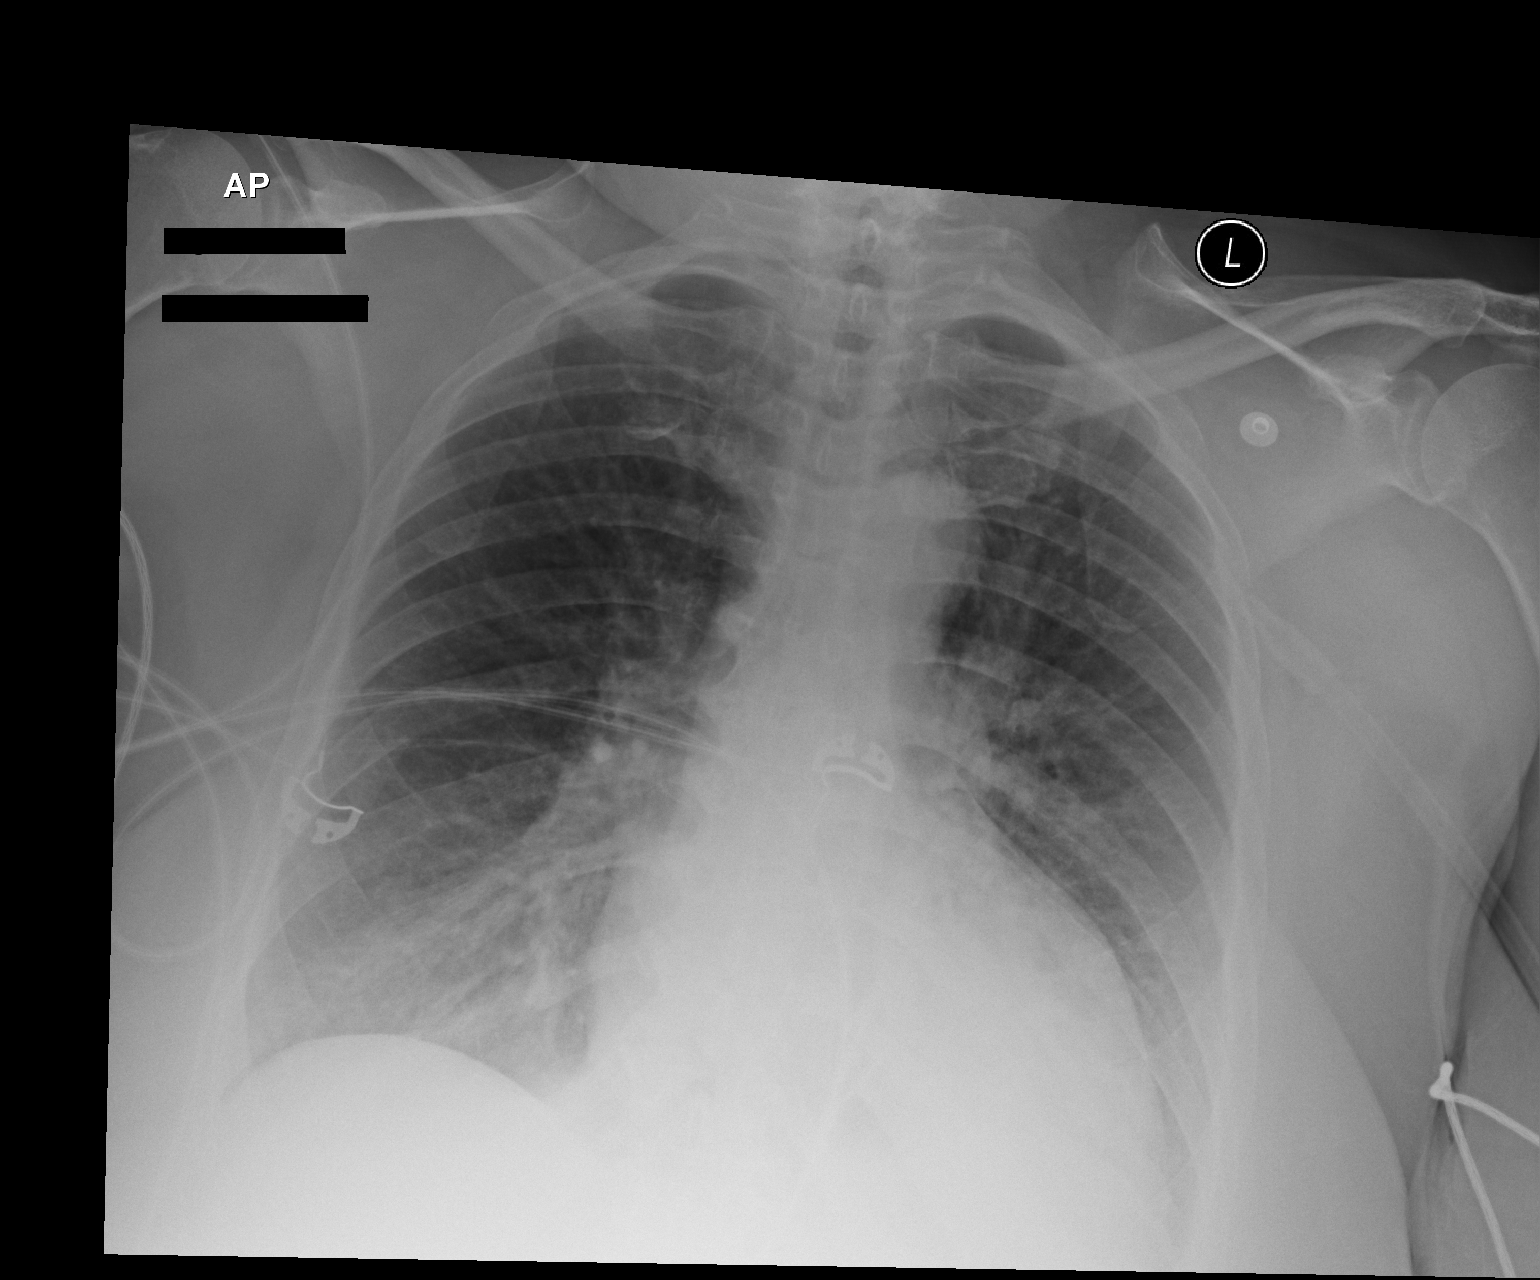

[1 of 1 positions shown; findings below may reference images not displayed]

FINDINGS: Endotracheal and nasogastric tubes have been removed. Stable
cardiomediastinal silhouette. No pneumothorax or significant pleural
effusion is noted. Degenerative change of mid thoracic spine is
noted. Stable mild bibasilar subsegmental atelectasis is noted. Bony
thorax appears intact.
IMPRESSION: Stable mild bibasilar subsegmental atelectasis. No other acute
abnormality seen.

## 2017-07-27 DEATH — deceased
